# Patient Record
Sex: Female | Born: 1960 | Race: Black or African American | Hispanic: No | Smoking: Never smoker
Health system: Southern US, Community
[De-identification: ages and names within clinical notes are randomized; demographics above are authoritative.]

## PROBLEM LIST (undated history)

## (undated) DIAGNOSIS — A6000 Herpesviral infection of urogenital system, unspecified: Secondary | ICD-10-CM

## (undated) DIAGNOSIS — I10 Essential (primary) hypertension: Secondary | ICD-10-CM

## (undated) HISTORY — DX: Herpesviral infection of urogenital system, unspecified: A60.00

## (undated) HISTORY — DX: Essential (primary) hypertension: I10

## (undated) HISTORY — PX: RETINAL DETACHMENT SURGERY: SHX105

---

## 2002-07-27 DIAGNOSIS — R768 Other specified abnormal immunological findings in serum: Secondary | ICD-10-CM

## 2002-07-27 HISTORY — DX: Other specified abnormal immunological findings in serum: R76.8

## 2017-03-10 ENCOUNTER — Other Ambulatory Visit: Payer: Self-pay | Admitting: Obstetrics and Gynecology

## 2017-03-10 DIAGNOSIS — Z1231 Encounter for screening mammogram for malignant neoplasm of breast: Secondary | ICD-10-CM

## 2017-04-01 ENCOUNTER — Ambulatory Visit
Admission: RE | Admit: 2017-04-01 | Discharge: 2017-04-01 | Disposition: A | Discharge status: Home / Self Care | Payer: No Typology Code available for payment source | Source: Ambulatory Visit | Attending: Obstetrics and Gynecology | Admitting: Obstetrics and Gynecology

## 2017-04-01 ENCOUNTER — Ambulatory Visit (HOSPITAL_COMMUNITY)
Admission: RE | Admit: 2017-04-01 | Discharge: 2017-04-01 | Disposition: A | Discharge status: Home / Self Care | Payer: Self-pay | Source: Ambulatory Visit | Attending: Obstetrics and Gynecology | Admitting: Obstetrics and Gynecology

## 2017-04-01 ENCOUNTER — Encounter (HOSPITAL_COMMUNITY): Payer: Self-pay

## 2017-04-01 VITALS — BP 126/78 | HR 71 | Temp 98.2°F | Ht 62.0 in | Wt 108.0 lb

## 2017-04-01 DIAGNOSIS — Z1231 Encounter for screening mammogram for malignant neoplasm of breast: Secondary | ICD-10-CM

## 2017-04-01 DIAGNOSIS — Z1239 Encounter for other screening for malignant neoplasm of breast: Secondary | ICD-10-CM

## 2017-04-01 NOTE — Patient Instructions (Signed)
Explained breast self awareness with Sharyn BlitzYvette Ressel. Patient did not need a Pap smear today due to last Pap smear was in April 2017 per patient. Let her know [facility] will cover Pap smears every 3 years unless has a history of abnormal Pap smears. [facility]. Appointment scheduled for Thursday, April 01, 2017 at 1110. Let patient know the Breast Center will follow up with her within the next couple weeks with results of mammogram by letter or phone. Sharyn BlitzYvette Woodford verbalized understanding.  Tyshay Adee, Kathaleen Maserhristine Poll, RN 10:25 AM

## 2017-04-01 NOTE — Progress Notes (Signed)
No complaints today.   Pap Smear: Pap smear not completed today. Last Pap smear was in April 2017 at Dr. Allene PyoNewman's [place] in [place] and normal per patient. Per patient has a history of an abnormal Pap smear 40 years ago that cryotherapy was completed for follow-up. Per patient all Pap smears have been normal since cryotherapy. No Pap smear results are in EPIC.  Physical exam: Breasts Breasts symmetrical. No skin abnormalities bilateral breasts. No nipple retraction bilateral breasts. No nipple discharge bilateral breasts. No lymphadenopathy. No lumps palpated bilateral breasts. No complaints of pain or tenderness on exam. [facility]. Appointment scheduled for Thursday, April 01, 2017 at 1110.        Pelvic/Bimanual No Pap smear completed today since last Pap smear was in April 2017 per patient. Pap smear not indicated per [facility] guidelines.   Smoking History: Patient has never smoked.  Patient Navigation: Patient education provided. Access to services provided for patient through [facility] program.   Colorectal Cancer Screening: Per patient had a colonoscopy completed 9 years ago. No complaints today. FIT Test given to patient to complete and return to [facility].

## 2017-04-11 ENCOUNTER — Other Ambulatory Visit: Payer: Self-pay | Admitting: Obstetrics and Gynecology

## 2017-04-17 LAB — FECAL OCCULT BLOOD, IMMUNOCHEMICAL: Fecal Occult Bld: NEGATIVE

## 2017-04-19 ENCOUNTER — Encounter (HOSPITAL_COMMUNITY): Payer: Self-pay | Admitting: *Deleted

## 2017-04-19 NOTE — Progress Notes (Signed)
Letter mailed to patient with negative Fit Test results.  

## 2018-04-11 ENCOUNTER — Other Ambulatory Visit: Payer: Self-pay | Admitting: Family Medicine

## 2018-04-11 ENCOUNTER — Ambulatory Visit
Admission: RE | Admit: 2018-04-11 | Discharge: 2018-04-11 | Disposition: A | Discharge status: Home / Self Care | Payer: BLUE CROSS/BLUE SHIELD | Source: Ambulatory Visit | Attending: Family Medicine | Admitting: Family Medicine

## 2018-04-11 DIAGNOSIS — Z1231 Encounter for screening mammogram for malignant neoplasm of breast: Secondary | ICD-10-CM

## 2018-07-28 ENCOUNTER — Other Ambulatory Visit: Payer: Self-pay

## 2018-07-28 ENCOUNTER — Ambulatory Visit (HOSPITAL_COMMUNITY)
Admission: EM | Admit: 2018-07-28 | Discharge: 2018-07-28 | Disposition: A | Discharge status: Home / Self Care | Payer: BLUE CROSS/BLUE SHIELD

## 2018-07-28 ENCOUNTER — Encounter (HOSPITAL_COMMUNITY): Payer: Self-pay | Admitting: Emergency Medicine

## 2018-07-28 DIAGNOSIS — H6991 Unspecified Eustachian tube disorder, right ear: Secondary | ICD-10-CM

## 2018-07-28 DIAGNOSIS — H6981 Other specified disorders of Eustachian tube, right ear: Secondary | ICD-10-CM | POA: Diagnosis not present

## 2018-07-28 DIAGNOSIS — H6591 Unspecified nonsuppurative otitis media, right ear: Secondary | ICD-10-CM | POA: Insufficient documentation

## 2018-07-28 MED ORDER — CETIRIZINE HCL 10 MG PO TABS: 10.0000 mg | ORAL_TABLET | Freq: Every day | ORAL | 0 refills | Status: DC

## 2018-07-28 MED ORDER — FLUTICASONE PROPIONATE 50 MCG/ACT NA SUSP: 1.0000 | Freq: Every day | NASAL | 2 refills | Status: DC

## 2018-07-28 NOTE — Discharge Instructions (Addendum)
We are treating for sinus congestion and fluid in the ear.  Medications sent to the pharmacy

## 2018-07-28 NOTE — ED Triage Notes (Signed)
PT reports right ear fullness for 2 days.

## 2018-07-31 NOTE — ED Provider Notes (Signed)
[facility]    CSN: [id number] Arrival date & time: 07/28/18  1537     History   Chief Complaint Chief Complaint  Patient presents with  . Ear Fullness    HPI Suella BroadYvette M Nelson is a 58 y.o. female.   Patient is a 58 year old female that presents with bilateral ear pressure, fullness x2 days.  She denies any associated pain in the ears.  Her symptoms have been waxing and waning.  She does not take anything for symptoms.  She denies any associated nasal congestion, drainage, cough, chest congestion, fevers, chills, body aches, night sweats.  ROS per HPI      History reviewed. No pertinent past medical history.  There are no active problems to display for this patient.   History reviewed. No pertinent surgical history.  OB History   No obstetric history on file.      Home Medications    Prior to Admission medications   Medication Sig Start Date End Date Taking? Authorizing Provider  telmisartan-hydrochlorothiazide (MICARDIS HCT) 40-12.5 MG tablet Take 1 tablet by mouth daily.   Yes [provider]  cetirizine (ZYRTEC) 10 MG tablet Take 1 tablet (10 mg total) by mouth daily. 07/28/18   Alexea Blase, Gloris Manchesterraci A, NP  fluticasone (FLONASE) 50 MCG/ACT nasal spray Place 1 spray into both nostrils daily. 07/28/18   Janace ArisBast, Charnay Nazario A, NP    Family History No family history on file.  Social History Social History   Tobacco Use  . Smoking status: Never Smoker  Substance Use Topics  . Alcohol use: No  . Drug use: No     Allergies   Sulfa antibiotics   Review of Systems Review of Systems   Physical Exam Triage Vital Signs ED Triage Vitals  Enc Vitals Group     BP 07/28/18 1634 133/75     Pulse Rate 07/28/18 1634 68     Resp 07/28/18 1634 16     Temp 07/28/18 1634 98.1 F (36.7 C)     Temp Source 07/28/18 1634 Oral     SpO2 07/28/18 1634 100 %     Weight --      Height --      Head Circumference --      Peak Flow --      Pain Score 07/28/18 1633 4       Pain Loc --      Pain Edu? --      Excl. in GC? --    No data found.  Updated Vital Signs BP 133/75   Pulse 68   Temp 98.1 F (36.7 C) (Oral)   Resp 16   SpO2 100%   Visual Acuity Right Eye Distance:   Left Eye Distance:   Bilateral Distance:    Right Eye Near:   Left Eye Near:    Bilateral Near:     Physical Exam Vitals signs and nursing note reviewed.  Constitutional:      Appearance: Normal appearance. She is not ill-appearing or toxic-appearing.  HENT:     Head: Normocephalic and atraumatic.     Right Ear: Decreased hearing noted. A middle ear effusion is present. Tympanic membrane is not erythematous, retracted or bulging.     Left Ear: Hearing, tympanic membrane and ear canal normal.     Nose: Nose normal.     Mouth/Throat:     Mouth: Mucous membranes are moist.     Pharynx: Oropharynx is clear.  Eyes:     Conjunctiva/sclera: Conjunctivae  normal.  Cardiovascular:     Rate and Rhythm: Normal rate and regular rhythm.     Heart sounds: Normal heart sounds.  Pulmonary:     Effort: Pulmonary effort is normal.     Breath sounds: Normal breath sounds.  Musculoskeletal: Normal range of motion.  Skin:    General: Skin is warm and dry.  Neurological:     Mental Status: She is alert.  Psychiatric:        Mood and Affect: Mood normal.      UC Treatments / Results  Labs (all labs ordered are listed, but only abnormal results are displayed) Labs Reviewed - No data to display  EKG None  Radiology No results found.  Procedures Procedures (including critical care time)  Medications Ordered in UC Medications - No data to display  Initial Impression / Assessment and Plan / UC Course  I have reviewed the triage vital signs and the nursing notes.  Pertinent labs & imaging results that were available during my care of the patient were reviewed by me and considered in my medical decision making (see chart for details).     Eustachian tube dysfunction  and right middle ear effusion Will treat with Flonase and Zyrtec for symptoms Follow up as needed for continued or worsening symptoms  Final Clinical Impressions(s) / UC Diagnoses   Final diagnoses:  Dysfunction of right eustachian tube  Fluid level behind tympanic membrane of right ear     Discharge Instructions     We are treating for sinus congestion and fluid in the ear.  Medications sent to the pharmacy    ED Prescriptions    Medication Sig Dispense Auth. Provider   fluticasone (FLONASE) 50 MCG/ACT nasal spray Place 1 spray into both nostrils daily. 16 g Sergi Gellner A, NP   cetirizine (ZYRTEC) 10 MG tablet Take 1 tablet (10 mg total) by mouth daily. 30 tablet Janace Aris, NP     Controlled Substance Prescriptions [place] Controlled Substance Registry consulted? no   Janace Aris, NP 07/31/18 2027

## 2019-04-18 ENCOUNTER — Other Ambulatory Visit: Payer: Self-pay

## 2019-04-18 DIAGNOSIS — Z20822 Contact with and (suspected) exposure to covid-19: Secondary | ICD-10-CM

## 2019-04-19 LAB — NOVEL CORONAVIRUS, NAA: SARS-CoV-2, NAA: NOT DETECTED

## 2019-06-13 ENCOUNTER — Other Ambulatory Visit: Payer: Self-pay

## 2019-06-13 DIAGNOSIS — Z20822 Contact with and (suspected) exposure to covid-19: Secondary | ICD-10-CM

## 2019-06-15 LAB — NOVEL CORONAVIRUS, NAA: SARS-CoV-2, NAA: NOT DETECTED

## 2019-09-04 ENCOUNTER — Other Ambulatory Visit: Payer: Self-pay | Admitting: *Deleted

## 2019-09-04 DIAGNOSIS — Z20822 Contact with and (suspected) exposure to covid-19: Secondary | ICD-10-CM

## 2019-09-05 LAB — NOVEL CORONAVIRUS, NAA: SARS-CoV-2, NAA: NOT DETECTED

## 2019-09-25 ENCOUNTER — Ambulatory Visit: Payer: Self-pay | Attending: Internal Medicine

## 2019-09-25 ENCOUNTER — Ambulatory Visit: Payer: BLUE CROSS/BLUE SHIELD

## 2019-09-25 DIAGNOSIS — Z23 Encounter for immunization: Secondary | ICD-10-CM | POA: Insufficient documentation

## 2019-09-25 NOTE — Progress Notes (Signed)
   Covid-19 Vaccination Clinic  Name:  Tasha Nelson    MRN: [id number] DOB: Jun 02, 1961  09/25/2019  [name] was observed post Covid-19 immunization for 15 minutes without incidence. She was provided with Vaccine Information Sheet and instruction to access the V-Safe system.   [name] was instructed to call 911 with any severe reactions post vaccine: Marland Kitchen Difficulty breathing  . Swelling of your face and throat  . A fast heartbeat  . A bad rash all over your body  . Dizziness and weakness    Immunizations Administered    Name Date Dose VIS Date Route   Pfizer COVID-19 Vaccine 09/25/2019  9:19 AM 0.3 mL 07/07/2019 Intramuscular   Manufacturer: [employer], [employer]   Lot: [id number]   NDC: [address]-5

## 2019-10-24 ENCOUNTER — Ambulatory Visit: Payer: Self-pay | Attending: Internal Medicine

## 2019-10-24 DIAGNOSIS — Z23 Encounter for immunization: Secondary | ICD-10-CM

## 2019-10-24 NOTE — Progress Notes (Signed)
   Covid-19 Vaccination Clinic  Name:  [name]    MRN: [id number] DOB: 1961/04/22  10/24/2019  [name] was observed post Covid-19 immunization for 15 minutes without incident. She was provided with Vaccine Information Sheet and instruction to access the V-Safe system.   [name] was instructed to call 911 with any severe reactions post vaccine: Marland Kitchen Difficulty breathing  . Swelling of face and throat  . A fast heartbeat  . A bad rash all over body  . Dizziness and weakness   Immunizations Administered    Name Date Dose VIS Date Route   Pfizer COVID-19 Vaccine 10/24/2019  4:15 PM 0.3 mL 07/07/2019 Intramuscular   Manufacturer: [employer], [employer]   Lot: [id number]   NDC: [address]-4

## 2020-02-02 ENCOUNTER — Other Ambulatory Visit: Payer: Self-pay

## 2020-02-02 ENCOUNTER — Ambulatory Visit (INDEPENDENT_AMBULATORY_CARE_PROVIDER_SITE_OTHER): Payer: 59 | Admitting: Family Medicine

## 2020-02-02 ENCOUNTER — Encounter: Payer: Self-pay | Admitting: Family Medicine

## 2020-02-02 VITALS — BP 98/58 | HR 82 | Ht 62.0 in | Wt 104.2 lb

## 2020-02-02 DIAGNOSIS — Z01419 Encounter for gynecological examination (general) (routine) without abnormal findings: Secondary | ICD-10-CM | POA: Insufficient documentation

## 2020-02-02 DIAGNOSIS — I10 Essential (primary) hypertension: Secondary | ICD-10-CM | POA: Diagnosis not present

## 2020-02-02 DIAGNOSIS — Z7689 Persons encountering health services in other specified circumstances: Secondary | ICD-10-CM

## 2020-02-02 DIAGNOSIS — A6000 Herpesviral infection of urogenital system, unspecified: Secondary | ICD-10-CM

## 2020-02-02 NOTE — Assessment & Plan Note (Addendum)
Well-controlled.  Uses Valacyclovir as needed.

## 2020-02-02 NOTE — Patient Instructions (Addendum)
Thank you for coming to see me today. It was a pleasure to meet you. Today we talked about:   Your health and updated everything.  Keep checking your blood pressure every morning.  Please follow-up with me in 2-3 months for labs and possible pap smear.  If you have any questions or concerns, please do not hesitate to call the office at [phone].  Best,   Luis Abed, DO

## 2020-02-02 NOTE — Assessment & Plan Note (Signed)
New patient packet reviewed.  Patient's past medical, surgical, family, social history, medication list updated in chart and verified with patient.  Patient brought with her records that show a prior positive hepatitis B surface antigen in 2004.  Other labs are in care everywhere she was previously seen in the [facility] system.  Health maintenance items that will be updated in the future include colonoscopy and Pap smear.  Patient plans to come back in 2 to 3 months to address these.

## 2020-02-02 NOTE — Progress Notes (Signed)
    SUBJECTIVE:   CHIEF COMPLAINT / HPI:   Establish Care Patient presents today to establish care Change in insurance prompted the change in PCP Previous PCP Donnal Moat, [place] [facility] LMP 10-15 yrs ago  Hypertension Meds: Telmisa-HCTZ 40-12.5mg  States that she keeps a log at home Her head felt "different" not painful on 7/4 and for this reason was checking it a lot It was fluctating that day, highest was 150/83 States that her blood pressure varies a lot, usually 120-140 SBP Usually checks BP every morning Has been under a lot of stress lately with looking for a job  Genital Herpes Reports it is very well-controlled  Uses valacyclovir as needed for outbreaks  Pap Smear Thinks one was abnormal in early 68s Last in 2017-2018  PERTINENT  PMH / PSH: Hypertension, Hx positive hep B surface antigen in 2004, genital herpes  OBJECTIVE:   BP (!) 98/58   Pulse 82   Ht 5\' 2"  (1.575 m)   Wt 104 lb 3.2 oz (47.3 kg)   SpO2 99%   BMI 19.06 kg/m    Physical Exam:  General: 59 y.o. female in NAD HEENT: NCAT Neck: Supple, no thyromegaly, no cervical LAD Cardio: RRR no m/r/g Lungs: CTAB, no wheezing, no rhonchi, no crackles, no IWOB on RA Abdomen: Soft, non-tender to palpation, non-distended, positive bowel sounds Skin: warm and dry Extremities: No edema   ASSESSMENT/PLAN:   Establishing care with new doctor, encounter for New patient packet reviewed.  Patient's past medical, surgical, family, social history, medication list updated in chart and verified with patient.  Patient brought with her records that show a prior positive hepatitis B surface antigen in 2004.  Other labs are in care everywhere she was previously seen in the [facility] system.  Health maintenance items that will be updated in the future include colonoscopy and Pap smear.  Patient plans to come back in 2 to 3 months to address these.  Essential hypertension Has been fluctuating, on lower  side today.  Patient is asymptomatic.  Does report some elevated blood pressures, but states overall she feels like her blood pressure is very well controlled.  Thinks that stress may be attributing to the increase in recent days as she has been looking for a job which is very stressful.  Offered ambulatory blood pressure monitoring, but she would like to wait and see if stress improves her blood pressure control.  Advised to continue to check in the morning.  If it continues to fluctuate, would refer to [facility] for ambulatory blood pressure monitoring.  For now, we will continue telmisartan-HCTZ at current dose.  Genital herpes simplex Well-controlled.  Uses Valacyclovir as needed.     Raymondo Band, DO [facility]

## 2020-02-02 NOTE — Assessment & Plan Note (Signed)
Has been fluctuating, on lower side today.  Patient is asymptomatic.  Does report some elevated blood pressures, but states overall she feels like her blood pressure is very well controlled.  Thinks that stress may be attributing to the increase in recent days as she has been looking for a job which is very stressful.  Offered ambulatory blood pressure monitoring, but she would like to wait and see if stress improves her blood pressure control.  Advised to continue to check in the morning.  If it continues to fluctuate, would refer to Dr. Raymondo Band for ambulatory blood pressure monitoring.  For now, we will continue telmisartan-HCTZ at current dose.

## 2020-03-14 ENCOUNTER — Telehealth: Payer: Self-pay | Admitting: *Deleted

## 2020-03-14 NOTE — Telephone Encounter (Signed)
Received fax requesting a refill on Tasha Nelson 40-12.5mg  tab QTY:90 PGF:[id number] 1 tablet by mouth daily.  Wasn't sure if this was the same as the one on her med list that is by a historical provider.Jameel Quant Zimmerman Rumple, CMA

## 2020-05-07 NOTE — Progress Notes (Signed)
SUBJECTIVE:   CHIEF COMPLAINT / HPI:   Hypertension Current regimen: Telmisartan-HCTZ 40-12.5 mg Last BMP 02/27/2019 She has been off her medication for about 2 months  Colon cancer screening Per office note on 02/27/2019 from previous PCP, patient had a colonoscopy in 2012 with normal results, due for repeat in 2022 Also reports that she had a negative Cologuard in 2018  Cervical cancer screening Per same office note as above, last Pap smear 2019 and normal Results cannot be found in Care Everywhere  Breast Cancer Screening She has been called and is going to schedule it Last was 04/04/2019  Back/Left Hip Pain Has low back pain and stiffness and left hip pain, loosens and pain improves as she walks more  Has been having back stiffness off and on for months No radiation down her legs No leg weakness, numbness, tingling She walks a lot at school Tries to do a lot of stretches 2018 an air conditioner fell on her left hip, but no problems until now She has been having pain and catching in left hip about 2-3 weeks, not every day Worse bending or twisting a certain way No change in bowel or bladder habits  PERTINENT  PMH / PSH: Hypertension, history of genital herpes  OBJECTIVE:   BP (!) 172/84   Pulse 71   Ht 5\' 2"  (1.575 m)   Wt 103 lb 3.2 oz (46.8 kg)   SpO2 99%   BMI 18.88 kg/m    Physical Exam:  General: 59 y.o. female in NAD Lungs: Breathing comfortably on room air Skin: warm and dry Extremities: No edema MSK:  Back Pain Inspection: no gross deformity, mildly decreased lumbar lordosis Palpation: No TTP along lumbar spinous processes, no step-offs noted ROM: Flexion limited to 150 degrees, full extension, sidebending, rotation Strength: 5/5 strength BLE Special Tests: Negative stork sign bilaterally, negative straight leg raise bilaterally Neurovascular: Intact distally bilaterally  Left Hip:  - Inspection: No gross deformity, no swelling, erythema, or  ecchymosis bilaterally - Palpation: No TTP, specifically none over greater trochanter bilaterally, no tenderness palpation over piriformis bilaterally - ROM: bilateral legs with hip flexion limited to 60 degrees bilaterally, Normal range of motion in extension, abduction, internal and external rotation - Strength: Normal strength bilaterally - Neuro/vasc: NV intact distally bilaterally - Special Tests: Negative FABER and FADIR bilaterally.  Negative logroll on left     ASSESSMENT/PLAN:   Essential hypertension Patient has been without her medication for over a month, likely 2 months.  Have sent a refill for this medication.  Advised patient to call if this happens again.  We will continue telmisartan-hydrochlorothiazide 40-12.5 mg daily right now.  Given that she has not been taking this medication, will wait for BMP in 1 month.  Will obtain screening labs including HIV and hepatitis C at that time as well.  She is asymptomatic from her elevated blood pressure and was advised to pick up her prescription as soon as possible and take her medication today.  Chronic bilateral low back pain without sciatica She does not have any evidence of disc involvement.  No signs of nerve compression as well.  She has very tight hamstrings on examination and this is likely contributing to her stiffness.  Also advised that given her pain improves with walking, she could have a small amount of arthritis.  Will work on gentle stretching range of motion exercises with patient, have her flex, extend, rotate, side bend and her back.  She has seen  a chiropractor before, advised that this would be fine to go back.  She does not seem to have any hip pathology on her exam aside from tight hamstrings and will work on stretches per above.   Patient received a flu shot today.  She will return in 1 month for blood pressure recheck and BMP, hepatitis C, HIV.  She was advised to schedule her mammogram.  She can have a  colonoscopy in 2022.  Unknown Jim, DO [facility]

## 2020-05-08 ENCOUNTER — Encounter: Payer: Self-pay | Admitting: Family Medicine

## 2020-05-08 ENCOUNTER — Ambulatory Visit (INDEPENDENT_AMBULATORY_CARE_PROVIDER_SITE_OTHER): Payer: 59 | Admitting: Family Medicine

## 2020-05-08 ENCOUNTER — Ambulatory Visit: Payer: 59 | Admitting: Family Medicine

## 2020-05-08 ENCOUNTER — Other Ambulatory Visit: Payer: Self-pay

## 2020-05-08 VITALS — BP 172/84 | HR 71 | Ht 62.0 in | Wt 103.2 lb

## 2020-05-08 DIAGNOSIS — I10 Essential (primary) hypertension: Secondary | ICD-10-CM | POA: Diagnosis not present

## 2020-05-08 DIAGNOSIS — Z23 Encounter for immunization: Secondary | ICD-10-CM

## 2020-05-08 DIAGNOSIS — M545 Low back pain, unspecified: Secondary | ICD-10-CM | POA: Diagnosis not present

## 2020-05-08 DIAGNOSIS — G8929 Other chronic pain: Secondary | ICD-10-CM | POA: Diagnosis not present

## 2020-05-08 MED ORDER — TELMISARTAN-HCTZ 40-12.5 MG PO TABS: 1.0000 | ORAL_TABLET | Freq: Every day | ORAL | 1 refills | Status: DC

## 2020-05-08 NOTE — Assessment & Plan Note (Signed)
Patient has been without her medication for over a month, likely 2 months.  Have sent a refill for this medication.  Advised patient to call if this happens again.  We will continue telmisartan-hydrochlorothiazide 40-12.5 mg daily right now.  Given that she has not been taking this medication, will wait for BMP in 1 month.  Will obtain screening labs including HIV and hepatitis C at that time as well.  She is asymptomatic from her elevated blood pressure and was advised to pick up her prescription as soon as possible and take her medication today.

## 2020-05-08 NOTE — Patient Instructions (Signed)
Thank you for coming to see me today. It was a pleasure. Today we talked about:   I have sent a prescription for your blood pressure medication. Please make sure that you pick this up today and take it today. We will see you back in 1 month and recheck your labs at that time when she has been on your medication for a month.  Schedule your breast mammogram.  For your back you can work on stretching, gentle flexion, extension, sidebending, rotation. Do not move past pain. When you feel the stretch, that is where you should hold it. You can also see the chiropractor as he has had success with this previously.  Please follow-up with me in 1 month.  If you have any questions or concerns, please do not hesitate to call the office at [phone].  Best,   Luis Abed, DO

## 2020-05-08 NOTE — Assessment & Plan Note (Signed)
She does not have any evidence of disc involvement.  No signs of nerve compression as well.  She has very tight hamstrings on examination and this is likely contributing to her stiffness.  Also advised that given her pain improves with walking, she could have a small amount of arthritis.  Will work on gentle stretching range of motion exercises with patient, have her flex, extend, rotate, side bend and her back.  She has seen a chiropractor before, advised that this would be fine to go back.  She does not seem to have any hip pathology on her exam aside from tight hamstrings and will work on stretches per above.

## 2020-05-21 ENCOUNTER — Other Ambulatory Visit: Payer: Self-pay | Admitting: Family Medicine

## 2020-05-21 DIAGNOSIS — Z1231 Encounter for screening mammogram for malignant neoplasm of breast: Secondary | ICD-10-CM

## 2020-05-22 ENCOUNTER — Other Ambulatory Visit: Payer: Self-pay

## 2020-05-22 ENCOUNTER — Ambulatory Visit
Admission: RE | Admit: 2020-05-22 | Discharge: 2020-05-22 | Disposition: A | Discharge status: Home / Self Care | Payer: 59 | Source: Ambulatory Visit | Attending: *Deleted | Admitting: *Deleted

## 2020-05-22 DIAGNOSIS — Z1231 Encounter for screening mammogram for malignant neoplasm of breast: Secondary | ICD-10-CM

## 2020-07-02 ENCOUNTER — Ambulatory Visit (INDEPENDENT_AMBULATORY_CARE_PROVIDER_SITE_OTHER): Payer: 59 | Admitting: Family Medicine

## 2020-07-02 ENCOUNTER — Encounter: Payer: Self-pay | Admitting: Family Medicine

## 2020-07-02 ENCOUNTER — Other Ambulatory Visit (HOSPITAL_COMMUNITY)
Admission: RE | Admit: 2020-07-02 | Discharge: 2020-07-02 | Disposition: A | Discharge status: Home / Self Care | Payer: 59 | Source: Ambulatory Visit | Attending: Family Medicine | Admitting: Family Medicine

## 2020-07-02 ENCOUNTER — Other Ambulatory Visit: Payer: Self-pay

## 2020-07-02 VITALS — BP 125/75 | HR 75 | Ht 62.0 in | Wt 104.6 lb

## 2020-07-02 DIAGNOSIS — I10 Essential (primary) hypertension: Secondary | ICD-10-CM | POA: Diagnosis not present

## 2020-07-02 DIAGNOSIS — Z114 Encounter for screening for human immunodeficiency virus [HIV]: Secondary | ICD-10-CM

## 2020-07-02 DIAGNOSIS — Z01419 Encounter for gynecological examination (general) (routine) without abnormal findings: Secondary | ICD-10-CM | POA: Diagnosis present

## 2020-07-02 DIAGNOSIS — Z1159 Encounter for screening for other viral diseases: Secondary | ICD-10-CM | POA: Diagnosis not present

## 2020-07-02 NOTE — Assessment & Plan Note (Signed)
BP significantly improved since restarting her medication.  Continue telmisartan-hydrochlorothiazide at current dose.  Will obtain a BMP today given that she had been off of her medication for 2 months.  Follow-up in 3 months.

## 2020-07-02 NOTE — Progress Notes (Signed)
    SUBJECTIVE:   CHIEF COMPLAINT / HPI:   Hypertension Current regimen: Telmisartan-hydrochlorothiazide 40-12.5 mg daily Has been taking Bps are improving, 105/65 most recent, 136/79 around the highest she has seen Denies chest pain, shortness of breath, leg swelling, changes in vision  Pap Smear Last Pap smear 2019 with normal reports per office note.  Prior paps: thinks she had something abnormal as a teenager No LMP recorded. Patient is postmenopausal.  Stopped having periods over 10 years Postmenopausal bleeding: none Sexually Active: no  Desire for STD Screening: none  Last mammogram: 05/22/2020, negative Self breast exams: yes  Concerns: none    PERTINENT  PMH / PSH: Hypertension, history of genital herpes  OBJECTIVE:   BP 125/75   Pulse 75   Ht 5\' 2"  (1.575 m)   Wt 104 lb 9.6 oz (47.4 kg)   SpO2 100%   BMI 19.13 kg/m    Physical Exam: General: In NAD Respiratory: Breathing comfortably on room air GU: Pelvic exam performed with patient supine.  Chaperone in room.  Bilateral labia without abnormalities.  Cervix exhibits no discharge, no cervix abnormalities.  No vaginal lesions, atrophic tissue noted.  No vaginal discharge.   ASSESSMENT/PLAN:   Women's annual routine gynecological examination Pap smear performed today with HPV interpretation.  If this is negative, would be her last Pap smear.  Essential hypertension BP significantly improved since restarting her medication.  Continue telmisartan-hydrochlorothiazide at current dose.  Will obtain a BMP today given that she had been off of her medication for 2 months.  Follow-up in 3 months.   Hepatitis C antibody and HIV antibody obtained for screening purposes.  , DO [facility]

## 2020-07-02 NOTE — Assessment & Plan Note (Signed)
Pap smear performed today with HPV interpretation.  If this is negative, would be her last Pap smear.

## 2020-07-02 NOTE — Patient Instructions (Signed)
Thank you for coming to see me today. It was a pleasure. Today we talked about:   We obtained a Pap smear today.  We will send you a letter with the results unless we need to do something, then we will call you.  We will get some labs today.  If they are abnormal or we need to do something about them, I will call you.  If they are normal, I will send you a message on MyChart (if it is active) or a letter in the mail.  If you don't hear from [place] in 2 weeks, please call the office at the number below.  Please follow-up with me in 3 months or sooner as needed for your blood pressure.  If you have any questions or concerns, please do not hesitate to call the office at [phone].  Best,   Luis Abed, DO

## 2020-07-03 ENCOUNTER — Encounter: Payer: Self-pay | Admitting: Family Medicine

## 2020-07-03 LAB — BASIC METABOLIC PANEL
BUN/Creatinine Ratio: 28 (ref 12–28)
BUN: 19 mg/dL (ref 8–27)
CO2: 23 mmol/L (ref 20–29)
Calcium: 9.9 mg/dL (ref 8.7–10.3)
Chloride: 99 mmol/L (ref 96–106)
Creatinine, Ser: 0.68 mg/dL (ref 0.57–1.00)
GFR calc Af Amer: 109 mL/min/{1.73_m2} (ref >59)
GFR calc non Af Amer: 95 mL/min/{1.73_m2} (ref >59)
Glucose: 84 mg/dL (ref 65–99)
Potassium: 3.8 mmol/L (ref 3.5–5.2)
Sodium: 141 mmol/L (ref 134–144)

## 2020-07-03 LAB — HIV ANTIBODY (ROUTINE TESTING W REFLEX): HIV Screen 4th Generation wRfx: NONREACTIVE

## 2020-07-03 LAB — HCV AB W REFLEX TO QUANT PCR: HCV Ab: <0.1 s/co ratio (ref 0.0–0.9)

## 2020-07-03 LAB — HCV INTERPRETATION

## 2020-07-04 LAB — CYTOLOGY - PAP
Comment: NEGATIVE
Diagnosis: NEGATIVE
High risk HPV: NEGATIVE

## 2020-09-16 ENCOUNTER — Telehealth: Payer: Self-pay

## 2020-09-16 NOTE — Telephone Encounter (Signed)
Patient calls nurse line requesting December lab results to be mailed to her home. I have printed labs and placed them in outgoing mail. Confirmed address with patient.

## 2020-10-04 ENCOUNTER — Telehealth: Payer: Self-pay | Admitting: Family Medicine

## 2020-10-04 NOTE — Telephone Encounter (Signed)
Patient stopped by was seen on 07/02/2020 she states had lab work done.  She would like nurse or her doctor to call her and explain in layman terms results of the blood work.  Patients p#[phone].

## 2020-10-07 NOTE — Telephone Encounter (Signed)
Spoke with patient.  She was wondering what the ** information listed from [facility] was at the bottom of the lab results.  Advised her that this is just a message that states they are updated their GFR calculations from change in 2021 guidelines.  Advised that her results are completely normal.  Discussed meaning of GFR.  She was reassured.  She states that she is concerned that she has scoliosis and will send photos.  Given mychart activation link.

## 2020-11-15 ENCOUNTER — Telehealth: Payer: Self-pay | Admitting: Family Medicine

## 2020-11-15 NOTE — Telephone Encounter (Signed)
Patient came in today with old paperwork from last December and she would like to know what the highlighted information on the letter means.  Put paperwork in white folder.  Please call patient she's just curious about it.  Her P#[phone].  Please call her.  Thanks

## 2020-11-20 NOTE — Telephone Encounter (Signed)
Confirmed with Grayce about this and she said she did not place it in white team folder she placed it in doctors box so I will route to PCP as an FYI. Davelle Anselmi Zimmerman Rumple, CMA

## 2020-11-21 NOTE — Telephone Encounter (Signed)
Called patient.  No answer.  Left VM that all labs were normal.  Advised that HPV is tested on all paps for patients age 62 and older, this was negative.  The cells of the pap smear itself were normal.  Also advised that Hep C Antibody testing, as we discussed at visit when it was ordered, is performed on all adults at least once and this was normal.  All other labs listed were completely normal.  Advised to call back if any further questions.

## 2020-12-02 ENCOUNTER — Ambulatory Visit (INDEPENDENT_AMBULATORY_CARE_PROVIDER_SITE_OTHER): Payer: 59 | Admitting: Family Medicine

## 2020-12-02 ENCOUNTER — Telehealth: Payer: Self-pay | Admitting: Family Medicine

## 2020-12-02 ENCOUNTER — Other Ambulatory Visit: Payer: Self-pay

## 2020-12-02 VITALS — BP 130/67 | HR 82 | Temp 98.2°F | Ht 62.0 in

## 2020-12-02 DIAGNOSIS — J302 Other seasonal allergic rhinitis: Secondary | ICD-10-CM | POA: Diagnosis not present

## 2020-12-02 MED ORDER — LORATADINE 10 MG PO TABS: 10.0000 mg | ORAL_TABLET | Freq: Every day | ORAL | 11 refills | Status: DC

## 2020-12-02 NOTE — Telephone Encounter (Signed)
Pt submitted document for review of medication alternative and cost.  Last appt was 12/02/20. Form placed white team folder.

## 2020-12-02 NOTE — Progress Notes (Signed)
    SUBJECTIVE:   CHIEF COMPLAINT / HPI:   Patient was mowing the lawn Thursday, and then after that she developd cough, sneezing, and scratchy throat.  She can feel drainage in the back of her throat.  Nose is productive of clear mucous.  She had a 'light temperature' this morning (It was 98.1, usually 97).  She takes benadryl for her allergies.  She only took two last night.  Did not take any prior to this..    When she was mowing the lawn she noticed something 'moving' when sh2 touched it under her left rib.  It was nontender.  She felt a slight pain there later when she coughed, but otherwise no pain..  She think thes handle her lawn more may hve been pushing into her ribs.  No difficulty breathing.  No bruising .   PERTINENT  PMH / PSH: Allergies  OBJECTIVE:   BP 130/67   Pulse 82   Temp 98.2 F (36.8 C) (Oral)   Ht 5\' 2"  (1.575 m)   SpO2 100%   BMI 19.13 kg/m   General: Alert, oriented.  No acute distress.  Thin female, appears stated age. HEENT: PERRLA, EOMI, moist oral mucosa, CV: Regular rate and rhythm, no murmurs : Lungs clear to auscultation bilaterally, no wheezes or crackles. MSK: Tip of the left 11th rib appreciated.  Nontender.  ASSESSMENT/PLAN:   Seasonal allergies Experienced rhinitis and cough after mowing the lawn recently.  Advised patient to take second-generation antihistamine such as Claritin or Zyrtec over Benadryl.  Discussed differences between the 2 with patient.  Also recommended Flonase if symptoms are severe enough.  Discussed use of Flonase as well.  Prescribed Claritin for patient.   Palpable left rib mass This mass she is feeling is consistent with the tip of the left 11th rib on my exam.  Reassured patient.  Advised her she can always follow-up if this starts to develop pain.  , MD [facility]

## 2020-12-02 NOTE — Assessment & Plan Note (Signed)
Experienced rhinitis and cough after mowing the lawn recently.  Advised patient to take second-generation antihistamine such as Claritin or Zyrtec over Benadryl.  Discussed differences between the 2 with patient.  Also recommended Flonase if symptoms are severe enough.  Discussed use of Flonase as well.  Prescribed Claritin for patient.

## 2020-12-02 NOTE — Patient Instructions (Signed)
It was nice to see you today,  Your cough and runny nose are most likely due to allergies.  Claritin or Zyrtec or over-the-counter options that I would prefer you use over Benadryl.  They are newer and have less side effects than Benadryl.  I have also sent in a prescription for Claritin, but it is over-the-counter and therefore might not be covered by your insurance.  You can also use Flonase for allergies.  When using this you need to spray in each nostril once a day and continue to do this every day for at least 3 to 4 weeks before deciding that its not working.  The lump you are feeling below your ribs is most likely your 11th rib.  The 11th and 12 ribs are called the floating ribs because they do not connect with the sternum.  Have a great day,  Frederic Jericho, MD

## 2020-12-04 NOTE — Telephone Encounter (Signed)
I reviewed the form in my inbox and it appears patient has questions about best treatment options for her hypertension.  This patient has had multiple questions about her labs and medications recently.  I think it would be best for her to come in for an appointment to better assess.  I last saw her in December and wanted to see her again in 3 months, so she is actually overdue to see me.  Would we be able to call her to schedule an appointment to discuss the medication and her blood pressure?  Thanks!

## 2020-12-04 NOTE — Telephone Encounter (Signed)
Clinical info completed on [employer] form.  Placed form in Dr Tamela Oddi box for completion.  Sunday Spillers, CMA

## 2020-12-10 NOTE — Telephone Encounter (Signed)
LVM for pt to call our office to schedule an appt to discuss the medication. Sunday Spillers, CMA

## 2020-12-13 NOTE — Telephone Encounter (Signed)
Pt called, scheduled an appt on 12/27/20. Sunday Spillers, CMA

## 2020-12-25 NOTE — Progress Notes (Signed)
    SUBJECTIVE:   CHIEF COMPLAINT / HPI:   Hypertension Current regimen: Telmisartan-hydrochlorothiazide 40-12.5 mg Takes it every day, but forgot to take her medication this morning Was told by insurance to check for formulary alternative Checks at home and they are in 120s SBP She had written a letter and dropped it off because her insurance wanted her to change from telmisartan-HCTZ to valsartan-HCTZ and she was concerned about the change in milligrams She reports that she has been focusing on her health and has been working on eating an appropriate diet and exercising more  She did a home stool test that was normal through [facility]  PERTINENT  PMH / PSH: Hypertension  OBJECTIVE:   BP (!) 141/80   Pulse 85   Ht 5\' 2"  (1.575 m)   Wt 107 lb 6.4 oz (48.7 kg)   SpO2 98%   BMI 19.64 kg/m    Physical Exam:  General: 60 y.o. female in NAD Lungs: Breathing comfortably on room air Skin: warm and dry Extremities: No edema   ASSESSMENT/PLAN:   Essential hypertension BP is elevated today, patient did not take her medication this morning because she forgot.  Reports well-controlled BPs at home.  Discussed at length the difference in milligram dosages for different medications and how they are not always equivalent.  Discussed with her that the equivalent dose of valsartan would be 160 mg to compare to her telmisartan 40 mg.  She would still like to start slower especially because she is hoping to wean herself off of blood pressure medication in the future by healthy lifestyle changes.  We will start her on valsartan-HCTZ 80-12.5 mg daily.  She will continue to check her blood pressures and was advised to call if they are consistently elevated with this change.  No washout needed given change from ARB to ARB.  We will have her follow-up in 1 month with her new PCP.   Colonoscopy ordered, given paper  66, DO [facility]

## 2020-12-27 ENCOUNTER — Other Ambulatory Visit: Payer: Self-pay

## 2020-12-27 ENCOUNTER — Encounter: Payer: Self-pay | Admitting: Family Medicine

## 2020-12-27 ENCOUNTER — Ambulatory Visit (INDEPENDENT_AMBULATORY_CARE_PROVIDER_SITE_OTHER): Payer: 59 | Admitting: Family Medicine

## 2020-12-27 VITALS — BP 141/80 | HR 85 | Ht 62.0 in | Wt 107.4 lb

## 2020-12-27 DIAGNOSIS — I10 Essential (primary) hypertension: Secondary | ICD-10-CM

## 2020-12-27 DIAGNOSIS — Z1211 Encounter for screening for malignant neoplasm of colon: Secondary | ICD-10-CM | POA: Diagnosis not present

## 2020-12-27 MED ORDER — VALSARTAN-HYDROCHLOROTHIAZIDE 80-12.5 MG PO TABS: 1.0000 | ORAL_TABLET | Freq: Every day | ORAL | 1 refills | Status: DC

## 2020-12-27 NOTE — Patient Instructions (Signed)
Thank you for coming to see me today. It was a pleasure. Today we talked about:   We will change from telmisartan-hydrochlorothiazide 40-12.5mg  to valsartan-hydrochlorothiazide 80mg -12.5mg .  This is a bit of a decrease, so keep an eye on your blood pressure.    Please follow-up with new PCP in 1 month.  If you have any questions or concerns, please do not hesitate to call the office at [phone].  Best,   [phone], DO

## 2020-12-27 NOTE — Assessment & Plan Note (Signed)
BP is elevated today, patient did not take her medication this morning because she forgot.  Reports well-controlled BPs at home.  Discussed at length the difference in milligram dosages for different medications and how they are not always equivalent.  Discussed with her that the equivalent dose of valsartan would be 160 mg to compare to her telmisartan 40 mg.  She would still like to start slower especially because she is hoping to wean herself off of blood pressure medication in the future by healthy lifestyle changes.  We will start her on valsartan-HCTZ 80-12.5 mg daily.  She will continue to check her blood pressures and was advised to call if they are consistently elevated with this change.  No washout needed given change from ARB to ARB.  We will have her follow-up in 1 month with her new PCP.

## 2021-01-03 ENCOUNTER — Ambulatory Visit (INDEPENDENT_AMBULATORY_CARE_PROVIDER_SITE_OTHER): Payer: 59 | Admitting: Family Medicine

## 2021-01-03 ENCOUNTER — Other Ambulatory Visit: Payer: Self-pay

## 2021-01-03 VITALS — BP 126/77 | HR 78 | Temp 97.3°F | Ht 62.0 in

## 2021-01-03 DIAGNOSIS — J069 Acute upper respiratory infection, unspecified: Secondary | ICD-10-CM | POA: Insufficient documentation

## 2021-01-03 DIAGNOSIS — J302 Other seasonal allergic rhinitis: Secondary | ICD-10-CM

## 2021-01-03 MED ORDER — BENZONATATE 100 MG PO CAPS: 100.0000 mg | ORAL_CAPSULE | Freq: Two times a day (BID) | ORAL | 0 refills | Status: DC | PRN

## 2021-01-03 MED ORDER — LORATADINE 10 MG PO TABS: 10.0000 mg | ORAL_TABLET | Freq: Every day | ORAL | 11 refills | Status: DC

## 2021-01-03 MED ORDER — FLUTICASONE PROPIONATE 50 MCG/ACT NA SUSP: 1.0000 | Freq: Every day | NASAL | 2 refills | Status: DC

## 2021-01-03 NOTE — Progress Notes (Signed)
    SUBJECTIVE:   CHIEF COMPLAINT / HPI: cold symptoms  URI: Primary symptom: coughing at night, post-tussive emesis, runny nose Duration: 1 week Fever? 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  PERTINENT  PMH / PSH: seasonal allergies  OBJECTIVE:   BP 126/77   Pulse 78   Temp (!) 97.3 F (36.3 C)   Ht 5\' 2"  (1.575 m)   SpO2 98%   BMI 19.64 kg/m   Nursing note and vitals reviewed GEN: age-appropriate AAW, resting comfortably in chair, NAD, WNWD HEENT: NCAT. PERRLA. Sclera without injection or icterus. MMM. Erythematous oropharynx without exudate. Edematous and erythematous sinus passages. Neck: Supple. No LAD. Cardiac: Regular rate and rhythm. Normal S1/S2. No murmurs, rubs, or gallops appreciated. 2+ radial pulses. Lungs: Clear bilaterally to ascultation. No increased WOB, no accessory muscle usage. No w/r/r. Neuro: Alert and at baseline Ext: no edema Psych: Pleasant and appropriate   ASSESSMENT/PLAN:   Viral URI with cough Patient with most likely viral URI with cough. No red flag symptoms, clear pulmonary exam. Pain in ribs likely related to coughing, no use of cxr at this point with reassuring exam. Even with rib fx, cxr would not change management. Recommend tylenol, compression, heat/ice for pain. Recommend treating symptoms with flonase, second gen antihistamine, tessalon perles for cough. Given supportive care instructions and return precautions. Limited utility for more COVID testing after negative test yesterday.     , MD [facility]

## 2021-01-03 NOTE — Assessment & Plan Note (Addendum)
Patient with most likely viral URI with cough. No red flag symptoms, clear pulmonary exam. Pain in ribs likely related to coughing, no use of cxr at this point with reassuring exam. Even with rib fx, cxr would not change management. Recommend tylenol, compression, heat/ice for pain. Recommend treating symptoms with flonase, second gen antihistamine, tessalon perles for cough. Given supportive care instructions and return precautions. Limited utility for more COVID testing after negative test yesterday.

## 2021-01-03 NOTE — Patient Instructions (Addendum)
It was a pleasure to see you today!  You likely have a viral upper respiratory syndrome. Symptoms typically peak at 2-3 days of illness and then gradually improve over 10-14 days. However, a cough may last 3-8 weeks.  Please stay hydrated. Eating warm liquids such as chicken soup or tea may also help with nasal congestion. You can use saline rinse for nasal congestion, I recommend this at night prior to bed time Also flonase will help reduce nasal congestion and swelling, but works best when used for 1-6 weeks. I would start this now, but in future start using about 3 weeks before you typically get allergy symptoms For cough you can use benzonatate (tessalon perles) twice a day. You can also use warm fluids for sore throat, honey, or humidifer. You can also try camomile or peppermint tea. I also recommend getting the COVID booster when you feel better Please call us back if you: Cannot drink anything for a prolonged period Having difficulty breathing, working hard to breathe, or breathing rapidly Have a fever greater than 100.70F   Feel better! Dr. Leary Roca

## 2021-03-11 ENCOUNTER — Encounter: Payer: Self-pay | Admitting: Family Medicine

## 2021-03-11 ENCOUNTER — Other Ambulatory Visit: Payer: Self-pay

## 2021-03-11 ENCOUNTER — Ambulatory Visit: Payer: 59 | Admitting: Family Medicine

## 2021-03-11 VITALS — BP 122/68 | HR 73 | Ht 62.0 in | Wt 105.0 lb

## 2021-03-11 DIAGNOSIS — J302 Other seasonal allergic rhinitis: Secondary | ICD-10-CM

## 2021-03-11 DIAGNOSIS — I10 Essential (primary) hypertension: Secondary | ICD-10-CM

## 2021-03-11 NOTE — Progress Notes (Signed)
    SUBJECTIVE:   CHIEF COMPLAINT / HPI:   HTN folllow up Current regimen: Valsartan-HCTZ 80-12.5mg  daily (changed from telmisartan-HCTZ at last visit) Checks BP regularly at home, readings have been 110s-120s systolic, 70s diastolic. Only 2 readings above 140 systolic and 2 lower readings in the 100s systolic. She does pay attention to sodium intake Ideally would like to get off BP medication in the future if possible Last BMP 07/02/2020- normal  Cough Patient complains of cough that occurs during summer months, mostly in the mornings, associated with sneezing, clear nasal drainage, and patient states she feels post nasal drip. No itchy eyes. Took loratidine for 1 month but didn't notice any significant improvement so she stopped.  Bump on Left Side Patient recently noticed a small but hard bump on her left lower rib cage. Overall asymptomatic but sometimes hurts with coughing.   PERTINENT  PMH / PSH: HTN, HSV  OBJECTIVE:   BP 122/68   Pulse 73   Ht 5\' 2"  (1.575 m)   Wt 105 lb (47.6 kg)   SpO2 98%   BMI 19.20 kg/m   General: NAD, pleasant, able to participate in exam Respiratory: normal effort, CTAB Extremities: no edema or cyanosis. MSK: 38mm hard nodule on L anterior 10th rib, nontender to palpation Skin: warm and dry, no rashes noted Neuro: alert, no obvious focal deficits Psych: Normal affect and mood   ASSESSMENT/PLAN:   Essential hypertension Chronic, very well controlled on current meds (valsartan-HCTZ 80-12.5mg  daily). Last BMP wnl in Dec 2021.  -Continue current regimen -Follow up in 3 months -Recheck BMP at next appointment -Patient to continue monitoring BP at home -She ideally wants to get off medication if possible. Could consider trial of single medication (valsartan or HCTZ alone) if BP remains very well controlled at next visit  Seasonal allergies Patient's cough likely related to seasonal allergies as it tends to occur in the summertime and is worse  in the morning. Associated with sneezing and post-nasal drip. Unlikely GERD or infectious etiology at this time. -Continue Loratidine, flonase, and saline nasal spray   Bump on Ribs Patient with very small, firm nodule in the region of her left anterior 10th rib (approximately). Most likely benign as it is very small, asymptomatic, and feels bony in nature. Do not feel there would be any intervention/treatment (medication or procedural), therefore will not pursue additional workup at this time. If enlarging or becoming symptomatic, return for re-evaluation.   Jan 2022, MD [facility]

## 2021-03-11 NOTE — Patient Instructions (Addendum)
It was great to see you!  Things we discussed at today's visit: - Continue taking your Valsartan-HCTZ daily.  - Keep checking your blood pressures regularly. Bring a written log to your next appointment. If you have concerns about consistently high or low readings, please call our office or send a MyChart message - We will check your labs at your next visit  - Continue taking the Loratidine daily for allergies. You can also continue using Flonase and Saline spray regularly  Take care and seek immediate care sooner if you develop any concerns.  Dr. [facility]

## 2021-03-11 NOTE — Assessment & Plan Note (Signed)
Patient's cough likely related to seasonal allergies as it tends to occur in the summertime and is worse in the morning. Associated with sneezing and post-nasal drip. Unlikely GERD or infectious etiology at this time. -Continue Loratidine, flonase, and saline nasal spray

## 2021-03-11 NOTE — Assessment & Plan Note (Signed)
Chronic, very well controlled on current meds (valsartan-HCTZ 80-12.5mg  daily). Last BMP wnl in Dec 2021.  -Continue current regimen -Follow up in 3 months -Recheck BMP at next appointment -Patient to continue monitoring BP at home -She ideally wants to get off medication if possible. Could consider trial of single medication (valsartan or HCTZ alone) if BP remains very well controlled at next visit

## 2021-04-24 ENCOUNTER — Ambulatory Visit (INDEPENDENT_AMBULATORY_CARE_PROVIDER_SITE_OTHER): Payer: 59 | Admitting: Family Medicine

## 2021-04-24 ENCOUNTER — Other Ambulatory Visit: Payer: Self-pay

## 2021-04-24 VITALS — BP 145/70 | HR 91

## 2021-04-24 DIAGNOSIS — R053 Chronic cough: Secondary | ICD-10-CM

## 2021-04-24 MED ORDER — BENZONATATE 100 MG PO CAPS: 100.0000 mg | ORAL_CAPSULE | Freq: Two times a day (BID) | ORAL | 0 refills | Status: DC | PRN

## 2021-04-24 MED ORDER — AMOXICILLIN-POT CLAVULANATE 875-125 MG PO TABS: 1.0000 | ORAL_TABLET | Freq: Two times a day (BID) | ORAL | 0 refills | Status: AC

## 2021-04-24 NOTE — Progress Notes (Signed)
    SUBJECTIVE:   CHIEF COMPLAINT / HPI:   Cough, congestion Patient presents for a persistent cough which she reports started in the summer but has been continuing since her last visit on 8/16.  She has been taking antihistamines as prescribed as well as using Flonase and nasal saline.  She reports that she is now having yellow congestion which has been persistent for the last 3 weeks or more.  Denies any shortness of breath or chest pain.  Denies any fever, chills.  Is concerned that this may be bacterial infection.   OBJECTIVE:   BP (!) 145/70   Pulse 91   SpO2 97%   General: Well-appearing 60 year old female, no acute distress Cardiac: Regular rate and rhythm, no murmurs appreciated HEENT: Moist mucous membranes, no rhinorrhea noted, swelling of nasal turbinates with mild erythema, mild erythema of posterior oropharynx Respiratory: Normal work of breathing, lungs clear to auscultation bilaterally, patient does cough during the evaluation and it is a deep congested cough although not appreciated on auscultation Abdomen: Soft, nontender MSK: No gross abnormalities  ASSESSMENT/PLAN:   Persistent cough Persistent cough with congestion for over a month.  Congestion has worsened and is now yellow.  May be worsening seasonal allergies but could also be bacterial in origin.  Have prescribed 1 week of antibiotics and Tessalon Perles for the cough.  Recommend the patient continues antihistamine use as well as Flonase.  Strict return precautions given.     Derrel Nip, MD [facility]

## 2021-04-24 NOTE — Patient Instructions (Signed)
It was wonderful seeing you today.  I am sorry you are still having issues with his cough.  I have sent a prescription to your pharmacy for some Augmentin as well as some Tessalon Perles which should help with the cough.  I would like for you to switch from the loratadine to cetirizine for antihistamine medications and continue to use the Flonase.  If your symptoms worsen, you have shortness of breath, you have chest pain please seek medical attention at the emergency department.  I hope you have a wonderful afternoon!

## 2021-04-26 DIAGNOSIS — R053 Chronic cough: Secondary | ICD-10-CM

## 2021-04-26 HISTORY — DX: Chronic cough: R05.3

## 2021-04-26 NOTE — Assessment & Plan Note (Signed)
Persistent cough with congestion for over a month.  Congestion has worsened and is now yellow.  May be worsening seasonal allergies but could also be bacterial in origin.  Have prescribed 1 week of antibiotics and Tessalon Perles for the cough.  Recommend the patient continues antihistamine use as well as Flonase.  Strict return precautions given.

## 2021-05-01 ENCOUNTER — Other Ambulatory Visit: Payer: Self-pay | Admitting: Family Medicine

## 2021-05-01 DIAGNOSIS — Z1231 Encounter for screening mammogram for malignant neoplasm of breast: Secondary | ICD-10-CM

## 2021-05-28 ENCOUNTER — Other Ambulatory Visit: Payer: Self-pay

## 2021-05-28 ENCOUNTER — Ambulatory Visit
Admission: RE | Admit: 2021-05-28 | Discharge: 2021-05-28 | Disposition: A | Discharge status: Home / Self Care | Payer: 59 | Source: Ambulatory Visit | Attending: Podiatry | Admitting: Podiatry

## 2021-05-28 DIAGNOSIS — Z1231 Encounter for screening mammogram for malignant neoplasm of breast: Secondary | ICD-10-CM

## 2021-06-07 ENCOUNTER — Other Ambulatory Visit: Payer: Self-pay | Admitting: Family Medicine

## 2021-06-07 DIAGNOSIS — I10 Essential (primary) hypertension: Secondary | ICD-10-CM

## 2021-09-23 ENCOUNTER — Other Ambulatory Visit: Payer: Self-pay

## 2021-09-23 ENCOUNTER — Ambulatory Visit (INDEPENDENT_AMBULATORY_CARE_PROVIDER_SITE_OTHER): Payer: BC Managed Care – PPO | Admitting: Family Medicine

## 2021-09-23 VITALS — BP 125/79 | HR 77 | Ht 62.0 in | Wt 105.0 lb

## 2021-09-23 DIAGNOSIS — S6992XA Unspecified injury of left wrist, hand and finger(s), initial encounter: Secondary | ICD-10-CM | POA: Diagnosis not present

## 2021-09-23 DIAGNOSIS — Z23 Encounter for immunization: Secondary | ICD-10-CM

## 2021-09-23 NOTE — Progress Notes (Signed)
° °  SUBJECTIVE:   CHIEF COMPLAINT / HPI:   Tasha Nelson is a 61 y.o. female here after injuring herself with a session.  Yesterday.  States that she was trying to put the pen on her scarf and the pin went through the top of her left index finger.  States that she had some brisk bleeding that was controlled quickly.  Denies current pain today.  She was unsure of her last tetanus shot so she decided to come in.   PERTINENT  PMH / PSH: reviewed and updated as appropriate   OBJECTIVE:   BP 125/79    Pulse 77    Ht 5\' 2"  (1.575 m)    Wt 105 lb (47.6 kg)    SpO2 98%    BMI 19.20 kg/m    GEN: well appearing female in no acute distress  CVS: well perfused  RESP: speaking in full sentences without pause, no respiratory distress  MSK: Normal range of motion of left index finger, entry and exit point identified, no bleeding or hematoma, brisk cap refill, sensation intact grossly SKIN: Warm, well-perfused  ASSESSMENT/PLAN:   Left index finger injury Tetanus updated today.  Overall exam unremarkable.  Return precautions provided and patient voiced understanding.  Healthcare maintenance Influenza vaccine provided Declines shingles vaccine at this time Discussed need for colonoscopy and patient wanting to wait into her insurance is updated  , DO PGY-3, [facility] 09/23/2021

## 2021-12-06 ENCOUNTER — Other Ambulatory Visit: Payer: Self-pay | Admitting: Family Medicine

## 2021-12-06 DIAGNOSIS — I10 Essential (primary) hypertension: Secondary | ICD-10-CM

## 2021-12-30 ENCOUNTER — Encounter: Payer: Self-pay | Admitting: *Deleted

## 2022-01-14 ENCOUNTER — Ambulatory Visit: Payer: BC Managed Care – PPO | Admitting: Family Medicine

## 2022-01-14 ENCOUNTER — Encounter: Payer: Self-pay | Admitting: Family Medicine

## 2022-01-14 DIAGNOSIS — L309 Dermatitis, unspecified: Secondary | ICD-10-CM

## 2022-01-14 HISTORY — DX: Dermatitis, unspecified: L30.9

## 2022-01-14 MED ORDER — HYDROCORTISONE 1 % EX OINT: 1.0000 | TOPICAL_OINTMENT | Freq: Two times a day (BID) | CUTANEOUS | 0 refills | Status: DC

## 2022-01-14 NOTE — Patient Instructions (Signed)
Thank you for coming to see me today. It was a pleasure. Today we discussed your nipple changes. It could be a dermatitis of the nipple. I recommend hydrocortisone cream to the area. Avoid irritating soaps/detergents to the area  Please follow-up with [place] if no improvement in symptoms   If you have any questions or concerns, please do not hesitate to call the office at [phone].  Best wishes,   Dr Allena Katz

## 2022-01-14 NOTE — Progress Notes (Signed)
     SUBJECTIVE:   CHIEF COMPLAINT / HPI:   Tasha Nelson is a 61 y.o. female presents for nipple concerns  Nipple concerns Pt reports left sided nipple pruritus for the last week. No new recent changes in soap, changes in bedsheets. Denies nipple pain, discharge, breast lump etc. May have injured her breasts when pushing against her law mower. Last mammogram in Nov 22 which was normal. No personal history or family history of breast cancer.   Flowsheet Row Office Visit from 01/14/2022 in [facility]  PHQ-9 Total Score 0        Health Maintenance Due  Topic   COLONOSCOPY (Pts 45-3yrs Insurance coverage will need to be confirmed)    Zoster Vaccines- Shingrix (1 of 2)   COVID-19 Vaccine (3 Chief Strategy Officer)      PERTINENT  PMH / PSH: HTN, genital herpes, seasonal allergies   OBJECTIVE:   BP (!) 142/85   Pulse 72   Ht 5\' 2"  (1.575 m)   Wt 107 lb 12.8 oz (48.9 kg)   SpO2 100%   BMI 19.72 kg/m    General: Alert, no acute distress Cardio: well perfused  Pulm: normal work of breathing Neuro: Cranial nerves grossly intact   Breast exam chaperoned by CMA April  Breasts: breasts appear normal, no suspicious masses, no skin changes or axillary nodes. Inverted left nipple  ASSESSMENT/PLAN:   Nipple dermatitis Most likely nipple dermatitis. Normal breast exam except slight inversion of left nipple. Very low suspicion for malignancy given lack of masses/skin changes on exam. Mammogram up to date and normal. Recommended hydrocortisone ointment to apply to the area. Follow up if no improvement in symptoms. Could trial antifungal ointment if no improvement.    May, MD PGY-3 [facility]

## 2022-01-15 NOTE — Assessment & Plan Note (Signed)
Most likely nipple dermatitis. Normal breast exam except slight inversion of left nipple. Very low suspicion for malignancy given lack of masses/skin changes on exam. Mammogram up to date and normal. Recommended hydrocortisone ointment to apply to the area. Follow up if no improvement in symptoms. Could trial antifungal ointment if no improvement.

## 2022-03-10 ENCOUNTER — Telehealth: Payer: Self-pay | Admitting: Family Medicine

## 2022-03-10 DIAGNOSIS — I10 Essential (primary) hypertension: Secondary | ICD-10-CM

## 2022-03-10 NOTE — Telephone Encounter (Signed)
Patient came in stating that she needs her bp medicine refilled, she only has 2 left.

## 2022-03-11 MED ORDER — VALSARTAN-HYDROCHLOROTHIAZIDE 80-12.5 MG PO TABS: 1.0000 | ORAL_TABLET | Freq: Every day | ORAL | 0 refills | Status: DC

## 2022-03-11 NOTE — Telephone Encounter (Signed)
Refill sent on her valsartan-HCTZ. Patient has upcoming appt with Dr Marsh Dolly-- recommend discussing HTN and obtaining BMP at that visit.

## 2022-03-23 ENCOUNTER — Encounter: Payer: Self-pay | Admitting: Family Medicine

## 2022-03-23 ENCOUNTER — Ambulatory Visit (INDEPENDENT_AMBULATORY_CARE_PROVIDER_SITE_OTHER): Payer: BC Managed Care – PPO | Admitting: Family Medicine

## 2022-03-23 VITALS — BP 120/60 | HR 94 | Ht 62.0 in | Wt 107.0 lb

## 2022-03-23 DIAGNOSIS — I10 Essential (primary) hypertension: Secondary | ICD-10-CM | POA: Diagnosis not present

## 2022-03-23 DIAGNOSIS — A6000 Herpesviral infection of urogenital system, unspecified: Secondary | ICD-10-CM | POA: Diagnosis not present

## 2022-03-23 DIAGNOSIS — Z1211 Encounter for screening for malignant neoplasm of colon: Secondary | ICD-10-CM

## 2022-03-23 DIAGNOSIS — R0989 Other specified symptoms and signs involving the circulatory and respiratory systems: Secondary | ICD-10-CM

## 2022-03-23 NOTE — Assessment & Plan Note (Signed)
Continue taking valtrex when you  Notice a flare

## 2022-03-23 NOTE — Assessment & Plan Note (Signed)
Pulse not visible on exam, felt on palpation, size appears normal. No concern as exam is unremarkable, pt has never smoked and hypertension under control.  - Patient will continue to monitor at home and follow up if necessary.

## 2022-03-23 NOTE — Progress Notes (Signed)
    SUBJECTIVE:   CHIEF COMPLAINT / HPI:   Rib Soreness Has been about 6 months since she was sick and had a persistent cough. Has been feeling rib symptoms for about 3 months. Not pain but feels uncomfortable. Does not feel it when she's exercising. Sometimes she feels it when she stretches.   Pulse in Abdomen  No chest pain or abdominal pain, just noticed seeing a pulse in her upper abdomen Denies palpitations No history of smoking or tobacco  Takes her hypertension medication daily    Health Maintenance  Has been trying to schedule her colonoscopy but had trouble  Denies lightheadedness with her hypertension medication  IS trying to incorporate strength exercises and protein shakes.   PERTINENT  PMH / PSH: HTN, Chronic back pain   OBJECTIVE:   BP 120/60   Pulse 94   Ht 5\' 2"  (1.575 m)   Wt 107 lb (48.5 kg)   SpO2 98%   BMI 19.57 kg/m   General: Small thin older lady in good health  CV: RRR, Abdominal aorta pulse palpable, normal size, well perfused  Resp: CTAB, normal work of breathing on room air  Normal movement of ribs with breathing  Abd: soft, non tender, non distended   ASSESSMENT/PLAN:   Essential hypertension Very well controlled. No negative side effects.  - Continue current regimen of Diovan   Genital herpes simplex Continue taking valtrex when you  Notice a flare   Pulse visible in abdominal aorta Pulse not visible on exam, felt on palpation, size appears normal. No concern as exam is unremarkable, pt has never smoked and hypertension under control.  - Patient will continue to monitor at home and follow up if necessary.    Health Maintenance - Referral to GI for colon cancer screening colonoscopy    , MD [facility]

## 2022-03-23 NOTE — Patient Instructions (Signed)
It was great to see you today! Thank you for choosing [facility] for your primary care. Tasha Nelson was seen for rib soreness.  Today we addressed: Floating rib - this is not concerning. You may feel your rib move more when you are doing some deep breathing or exercise.  Pulse in abdomen - This is also normal. Your exam showed that you had a normal sized abdominal aorta and pulse. Given that you have never smoked and your hypertension is well concerned, there is a low level of risk for any concerns regarding your abdominal aorta.  Hypertension - your hypertension is well controlled. If you get light headed on your current medication please let us know.  Health maintenance -I have put in a referral to GI for your colonoscopy. They should call you   If you haven't already, sign up for My Chart to have easy access to your labs results, and communication with your primary care physician.  We are checking some labs today. If they are abnormal, I will call you. If they are normal, I will send you a MyChart message (if it is active) or a letter in the mail. If you do not hear about your labs in the next 2 weeks, please call the office.   You should return to our clinic No follow-ups on file.  I recommend that you always bring your medications to each appointment as this makes it easy to ensure you are on the correct medications and helps [place] not miss refills when you need them.  Please arrive 15 minutes before your appointment to ensure smooth check in process.  We appreciate your efforts in making this happen.  Please call the clinic at [phone] if your symptoms worsen or you have any concerns.  Thank you for allowing me to participate in your care, Lockie Mola, MD 03/23/2022, 4:39 PM PGY-1, [facility]

## 2022-03-23 NOTE — Assessment & Plan Note (Signed)
Very well controlled. No negative side effects.  - Continue current regimen of Diovan

## 2022-04-23 IMAGING — MG MM DIGITAL SCREENING BILAT W/ TOMO AND CAD
8 series · 9 of 24 positions shown · non-contrast
Comparison: Previous exam(s).

CLINICAL DATA: Screening.

EXAM:
DIGITAL SCREENING BILATERAL MAMMOGRAM WITH TOMOSYNTHESIS AND CAD
TECHNIQUE: Bilateral screening digital craniocaudal and mediolateral oblique
mammograms were obtained. Bilateral screening digital breast
tomosynthesis was performed. The images were evaluated with
computer-aided detection.

[R CC synth-2D]
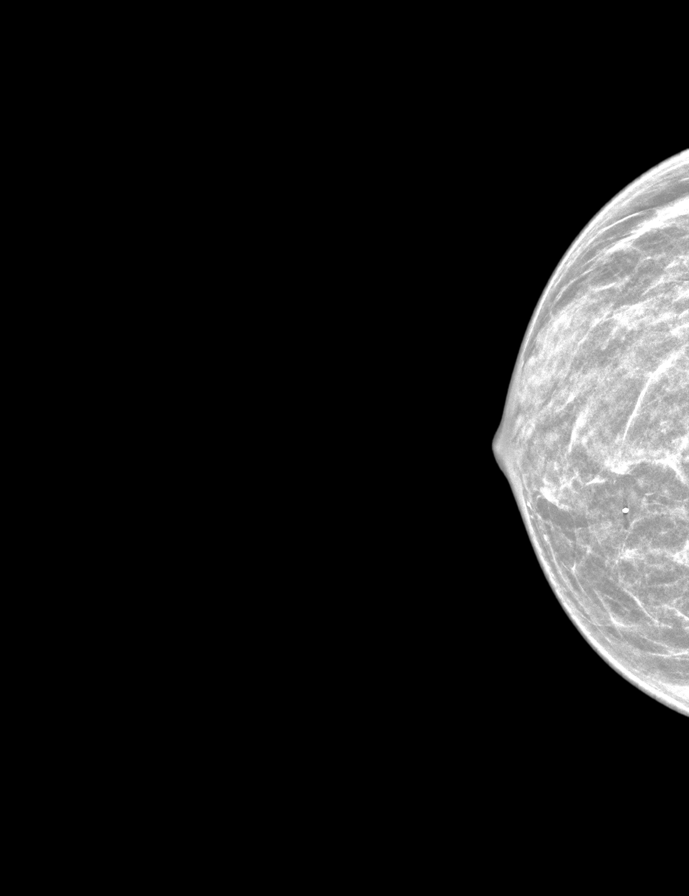

[L CC synth-2D]
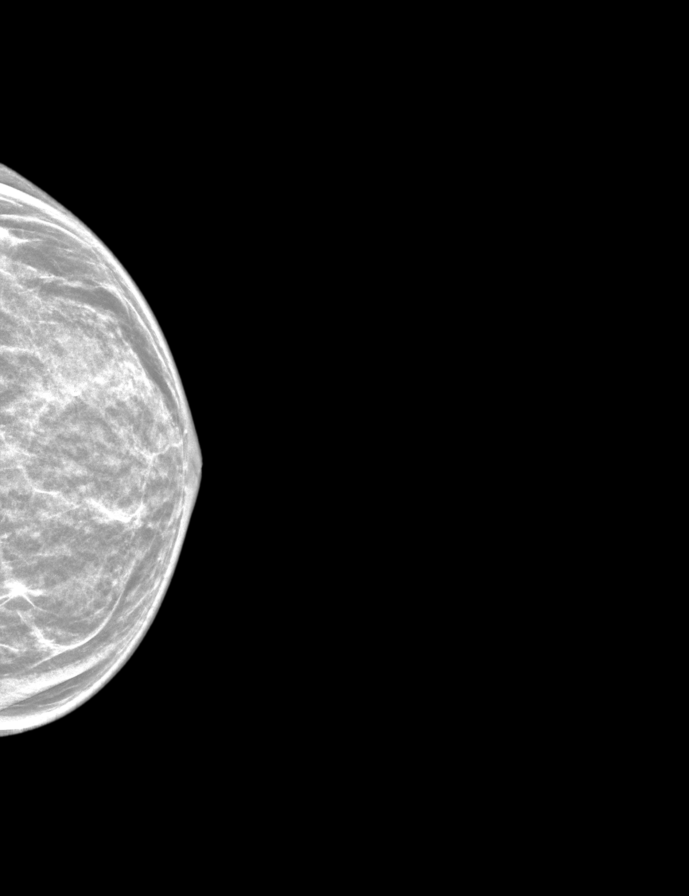

[L MLO synth-2D]
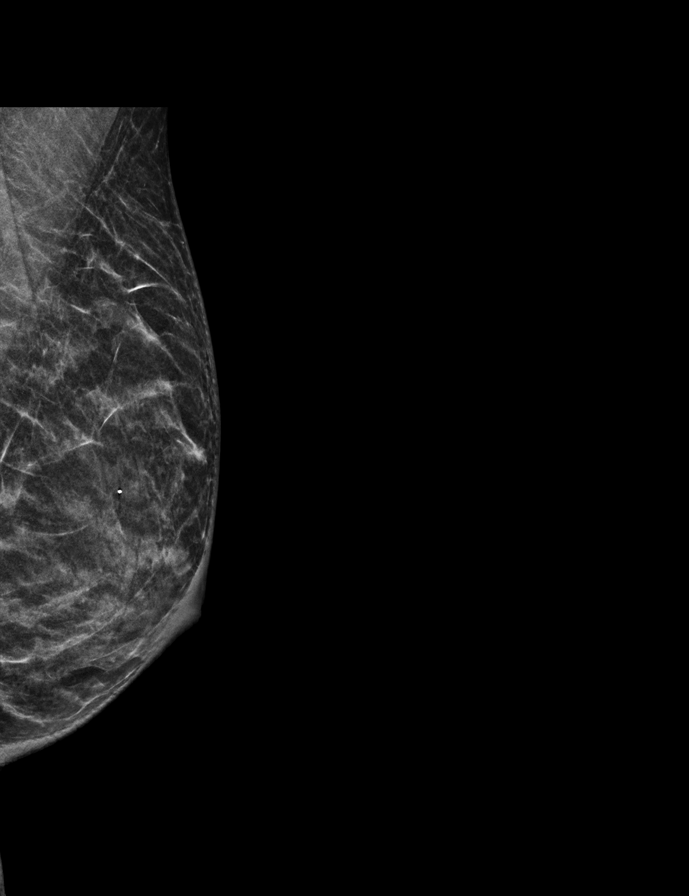

[R MLO synth-2D]
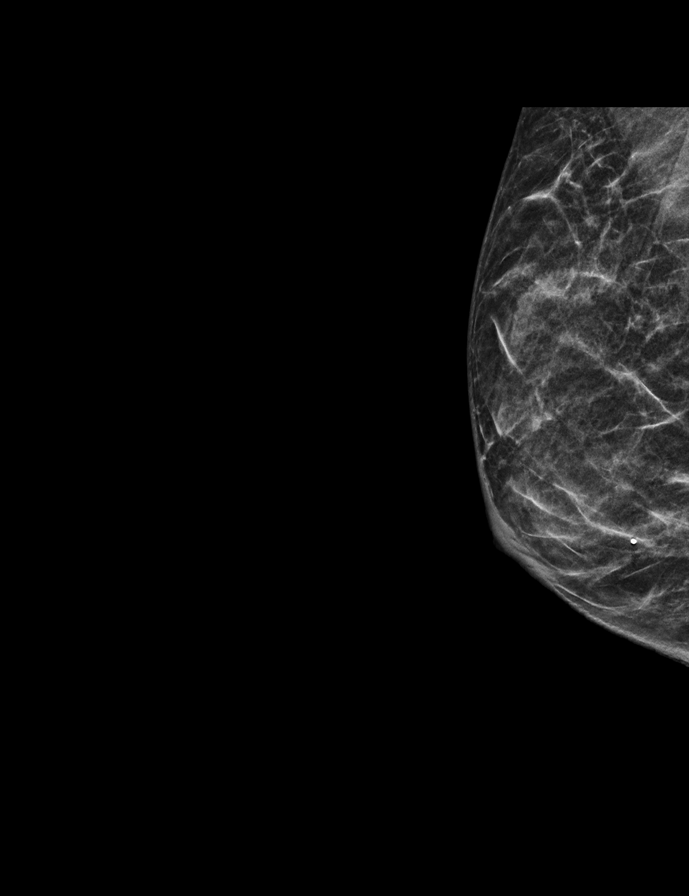

[L MLO tomo · 2 of 40 frames shown]
[frame 13/40]
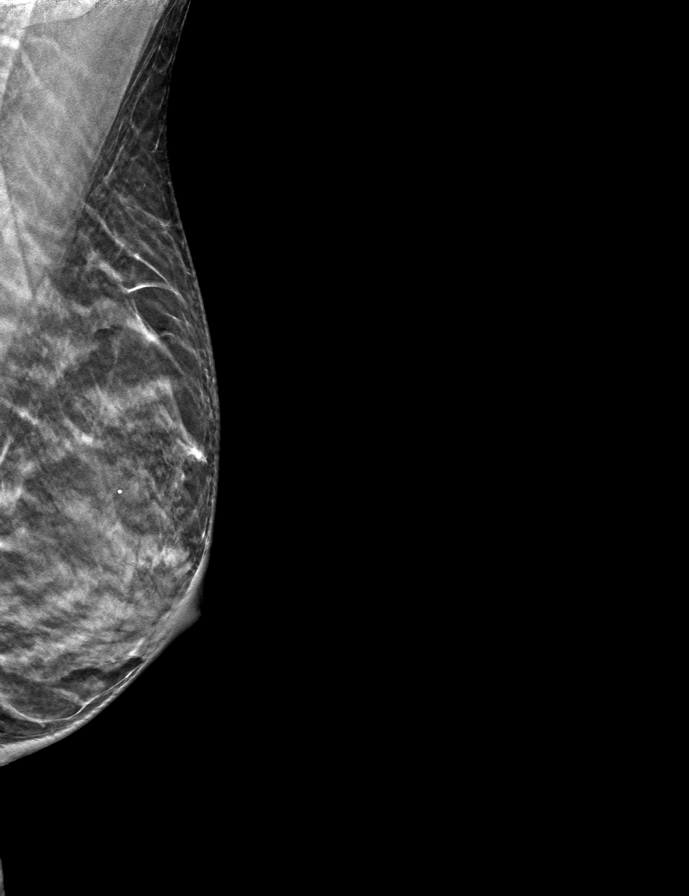
[frame 21/40]
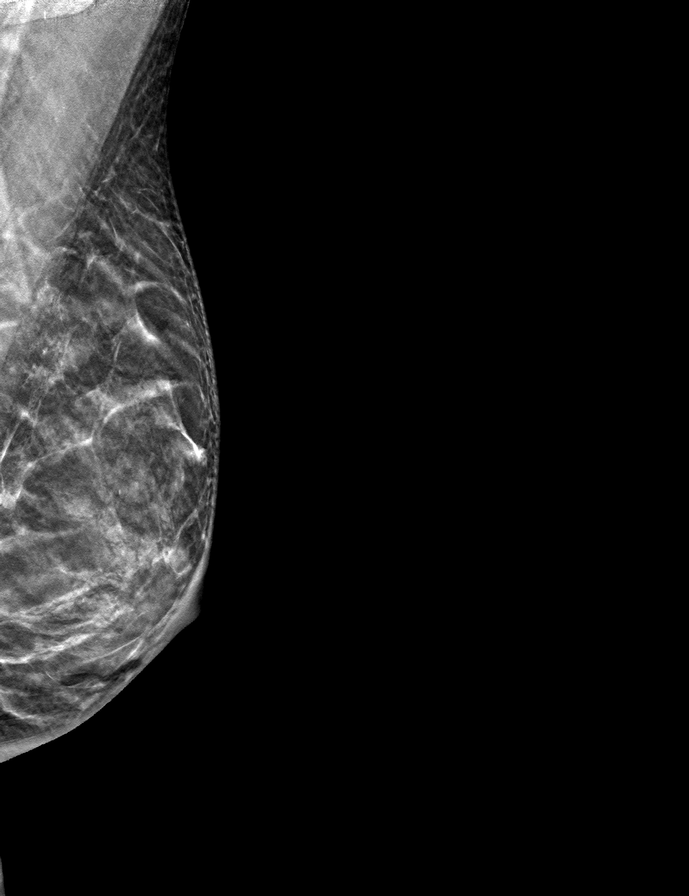

[R CC tomo · tomo slice 19/37.0]
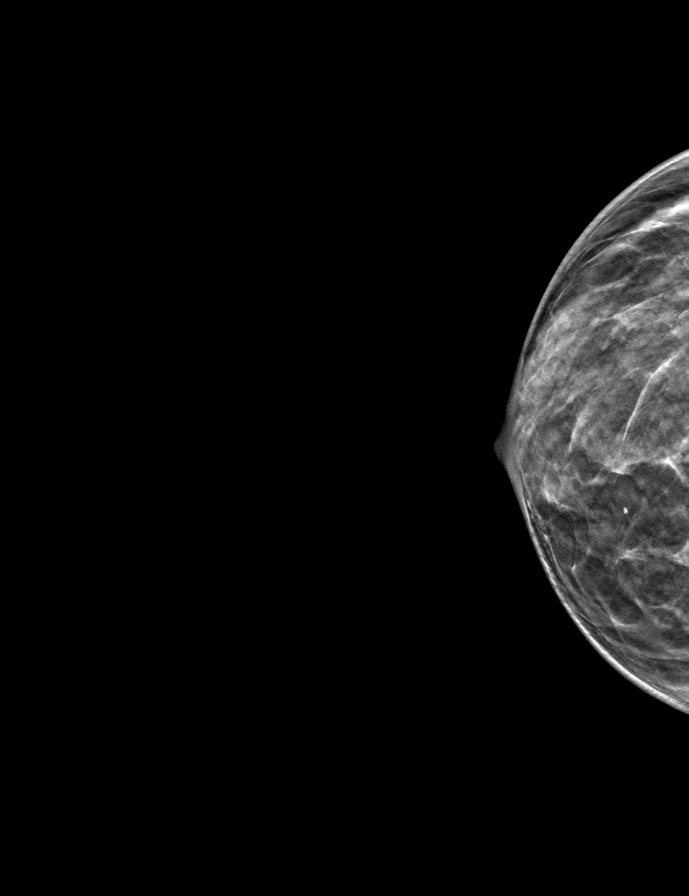

[L CC tomo · tomo slice 19/37.0]
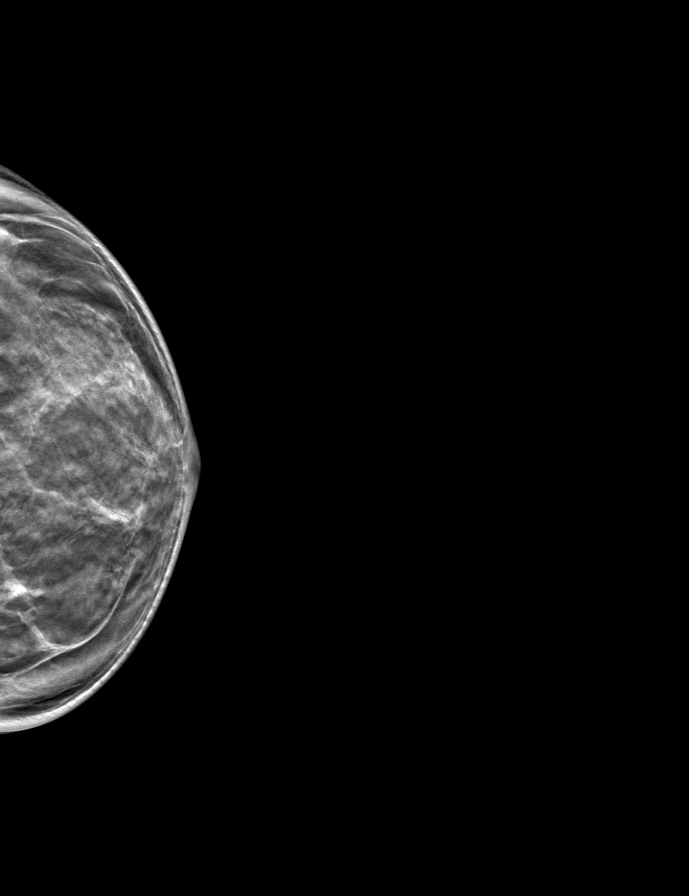

[R MLO tomo · tomo slice 19/38.0]
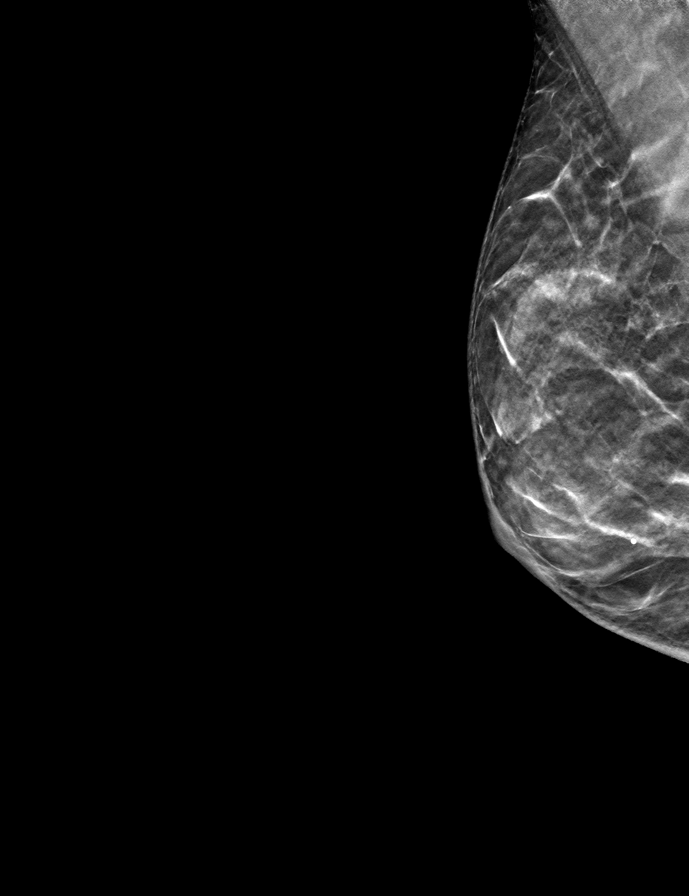

[9 of 24 positions shown; findings below may reference images not displayed]

ACR Breast Density Category c: The breast tissue is heterogeneously
dense, which may obscure small masses.
FINDINGS: There are no findings suspicious for malignancy.
IMPRESSION: No mammographic evidence of malignancy. A result letter of this
screening mammogram will be mailed directly to the patient.

RECOMMENDATION:
Screening mammogram in one year. (Code:[id number]-BC3)

BI-RADS CATEGORY  1: Negative.

## 2022-05-05 ENCOUNTER — Ambulatory Visit (INDEPENDENT_AMBULATORY_CARE_PROVIDER_SITE_OTHER): Payer: BC Managed Care – PPO | Admitting: Family Medicine

## 2022-05-05 ENCOUNTER — Encounter: Payer: Self-pay | Admitting: Family Medicine

## 2022-05-05 ENCOUNTER — Ambulatory Visit
Admission: RE | Admit: 2022-05-05 | Discharge: 2022-05-05 | Disposition: A | Discharge status: Home / Self Care | Payer: BC Managed Care – PPO | Source: Ambulatory Visit | Attending: Family Medicine | Admitting: Family Medicine

## 2022-05-05 VITALS — BP 130/70 | HR 105 | Wt 102.0 lb

## 2022-05-05 DIAGNOSIS — J302 Other seasonal allergic rhinitis: Secondary | ICD-10-CM | POA: Diagnosis not present

## 2022-05-05 DIAGNOSIS — R051 Acute cough: Secondary | ICD-10-CM

## 2022-05-05 DIAGNOSIS — Z13220 Encounter for screening for lipoid disorders: Secondary | ICD-10-CM | POA: Diagnosis not present

## 2022-05-05 DIAGNOSIS — I10 Essential (primary) hypertension: Secondary | ICD-10-CM | POA: Diagnosis not present

## 2022-05-05 MED ORDER — VALACYCLOVIR HCL 500 MG PO TABS: ORAL_TABLET | ORAL | 1 refills | Status: DC

## 2022-05-05 MED ORDER — VALSARTAN-HYDROCHLOROTHIAZIDE 80-12.5 MG PO TABS: 1.0000 | ORAL_TABLET | Freq: Every day | ORAL | 3 refills | Status: AC

## 2022-05-05 MED ORDER — FLUTICASONE PROPIONATE 50 MCG/ACT NA SUSP: 1.0000 | Freq: Every day | NASAL | 2 refills | Status: AC

## 2022-05-05 NOTE — Progress Notes (Unsigned)
    SUBJECTIVE:   CHIEF COMPLAINT / HPI:   Hypertension: Patient is a 61 y.o. female who presents today for HTN follow-up.  Home medications include: valsartan-HCTZ 80-12.5mg  Patient reports overall very good compliance-- missed dose once every month  Patient does check blood pressure at home. 130s/70-80s  Patient has not had a BMP in the past 1 year.  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     Alcus Dad, MD [place]

## 2022-05-05 NOTE — Patient Instructions (Signed)
It was great to see you!  Things we discussed at today's visit: - I have sent refills on your medications - We are checking some labs (kidney function, electrolytes, cholesterol) - I have ordered a chest x-ray. You can go to [place] ([place] or [place]) any time between 7am-7pm to have this done. No appointment needed.  - I will send you a MyChart message or call with the results   Take care and seek immediate care sooner if you develop any concerns.  Dr. [facility]

## 2022-05-06 ENCOUNTER — Telehealth: Payer: Self-pay

## 2022-05-06 LAB — LIPID PANEL
Chol/HDL Ratio: 3.4 ratio (ref 0.0–4.4)
Cholesterol, Total: 134 mg/dL (ref 100–199)
HDL: 39 mg/dL — ABNORMAL LOW (ref >39)
LDL Chol Calc (NIH): 80 mg/dL (ref 0–99)
Triglycerides: 78 mg/dL (ref 0–149)
VLDL Cholesterol Cal: 15 mg/dL (ref 5–40)

## 2022-05-06 LAB — BASIC METABOLIC PANEL
BUN/Creatinine Ratio: 32 — ABNORMAL HIGH (ref 12–28)
BUN: 22 mg/dL (ref 8–27)
CO2: 22 mmol/L (ref 20–29)
Calcium: 8.8 mg/dL (ref 8.7–10.3)
Chloride: 98 mmol/L (ref 96–106)
Creatinine, Ser: 0.68 mg/dL (ref 0.57–1.00)
Glucose: 96 mg/dL (ref 70–99)
Potassium: 4 mmol/L (ref 3.5–5.2)
Sodium: 138 mmol/L (ref 134–144)
eGFR: 98 mL/min/{1.73_m2} (ref >59)

## 2022-05-06 MED ORDER — AMOXICILLIN-POT CLAVULANATE 875-125 MG PO TABS: 1.0000 | ORAL_TABLET | Freq: Two times a day (BID) | ORAL | 0 refills | Status: DC

## 2022-05-06 NOTE — Telephone Encounter (Signed)
Returned patient's call to discuss.  Left VM informing her I have sent antibiotics to her pharmacy and she can call with any questions.  I will also try to reach her again this afternoon.   Alcus Dad, MD PGY-3, [place]

## 2022-05-06 NOTE — Assessment & Plan Note (Signed)
BP at goal. -Continue valsartan-HCTZ 80-12.5mg  daily. Refills sent -Obtain updated BMP today -Continue home BP monitoring

## 2022-05-06 NOTE — Telephone Encounter (Signed)
Attempted to call patient again to follow up. No answer.  Will send MyChart message informing of small pneumonia seen on CXR and antibiotics have been sent to her pharmacy.   Alcus Dad, MD PGY-3, [place]

## 2022-05-06 NOTE — Telephone Encounter (Signed)
Patient calls nurse line this morning regarding continued fever. She reports that most recent temp this AM was 102 orally. She has taken a cold medication with acetaminophen this AM around 0630.   Patient was unsure if she needs to be seen again or if antibiotic can be started. She also reports that she is coughing up dark green sputum.   Will forward to Dr. Rock Nephew for further advisement.   Talbot Grumbling, RN

## 2022-06-08 ENCOUNTER — Other Ambulatory Visit: Payer: Self-pay | Admitting: Family Medicine

## 2022-06-08 DIAGNOSIS — Z1231 Encounter for screening mammogram for malignant neoplasm of breast: Secondary | ICD-10-CM

## 2022-07-09 ENCOUNTER — Ambulatory Visit
Admission: RE | Admit: 2022-07-09 | Discharge: 2022-07-09 | Disposition: A | Discharge status: Home / Self Care | Payer: BC Managed Care – PPO | Source: Ambulatory Visit | Attending: Podiatry | Admitting: Podiatry

## 2022-07-09 ENCOUNTER — Ambulatory Visit: Payer: BC Managed Care – PPO

## 2022-07-09 DIAGNOSIS — Z1231 Encounter for screening mammogram for malignant neoplasm of breast: Secondary | ICD-10-CM

## 2022-09-24 ENCOUNTER — Encounter: Payer: Self-pay | Admitting: Family Medicine

## 2022-09-24 ENCOUNTER — Ambulatory Visit (INDEPENDENT_AMBULATORY_CARE_PROVIDER_SITE_OTHER): Payer: BC Managed Care – PPO | Admitting: Family Medicine

## 2022-09-24 VITALS — BP 124/76 | HR 84 | Ht 62.0 in | Wt 106.8 lb

## 2022-09-24 DIAGNOSIS — Z0000 Encounter for general adult medical examination without abnormal findings: Secondary | ICD-10-CM | POA: Diagnosis not present

## 2022-09-24 DIAGNOSIS — R0781 Pleurodynia: Secondary | ICD-10-CM | POA: Diagnosis not present

## 2022-09-24 DIAGNOSIS — R9389 Abnormal findings on diagnostic imaging of other specified body structures: Secondary | ICD-10-CM | POA: Diagnosis not present

## 2022-09-24 NOTE — Progress Notes (Signed)
    SUBJECTIVE:   Chief complaint/HPI: annual examination  Tasha Nelson is a 62 y.o. who presents today for an annual exam.   Concerns today: hard bony lump under left ribs-- previously told it was floating rib, but she's worried about it. Not painful. Not enlarging that she's aware of.   History tabs reviewed and updated.   Review of systems form reviewed and notable for: Concerned about her weight-- states other people think she's sick because she's thin. Wondering if she should add protein shakes.   OBJECTIVE:   BP 124/76   Pulse 84   Ht '5\' 2"'$  (1.575 m)   Wt 106 lb 12.8 oz (48.4 kg)   SpO2 98%   BMI 19.53 kg/m   Gen: NAD, pleasant, able to participate in exam HEENT: [place]/AT, PERRLA, nares patent bilaterally, TM normal bilaterally, oropharynx unremarkable Neck: supple, no cervical or supraclavicular lymphadenopathy, thyroid smooth and normal in size CV: RRR, normal S1/S2, no murmur Resp: Normal effort, lungs CTAB GI: abdomen soft, non-tender, non-distended Extremities: no edema or cyanosis Skin: warm and dry, no rashes noted Neuro: alert, no obvious focal deficits Psych: Normal affect and mood MSK: hard, bony nodule (~4m) inferior to left anterior 10th rib, nontender to palpation  ASSESSMENT/PLAN:   Annual Examination  See AVS for age appropriate recommendations  PHQ score 0, reviewed and discussed.  BP reviewed and at goal.  Advance directives discussion: sister PMardene Celestewould be decision-maker, wants to be full code at the present time. Blue packet given today  Considered the following items based upon USPSTF recommendations: Diabetes screening: not indicated Screening for elevated cholesterol: done 4 months ago-- no indication for repeat testing today HIV testing: negative 07/02/2020, no indication for repeat testing Hepatitis C: negative 07/02/2020, no indication for repeat today Syphilis if at high risk: not at high risk GC/CT not at high risk and not  ordered. Osteoporosis screening considered based upon risk of fracture [facility]. Major osteoporotic fracture risk is 9.4%. DEXA not ordered.  Reviewed risk factors for latent tuberculosis and not indicated  Discussed family history, BRCA testing not indicated.  Cervical cancer screening: prior Pap reviewed, repeat due in 2026 Breast cancer screening: up-to-date, normal mammo 07/09/2022 Colorectal cancer screening: discussed. Previously referred to GI-- given info in AVS to call and schedule Lung cancer screening: not indicated  Abnormal chest x-ray CXR from 05/05/22 showed consolidation likely 2/2 pneumonia but follow up CXR was recommended to ensure resolution and this has not been completed. F/u CXR ordered today.   Rib Complaint Hard nodule inferior to left anterior ribs, likely area of calcification, possibly calcification of costal cartilage. Will obtain dedicated left rib x-ray to evaluate further.  Weight Concern Patient wondering if she's underweight. BMI within normal range and weight has been stable since at least 2018. No changes necessary but ok to drink protein shakes between meals if she wishes.  Follow up in 1 year or sooner if indicated.    AAlcus Dad MD [place]

## 2022-09-24 NOTE — Patient Instructions (Addendum)
It was great to see you!  Things we discussed today: -We will check a chest x-ray and rib x-ray. You can go to [place] ([place]) any time between 7am-7pm to have this done. No appointment needed.  -You are due for a colonoscopy. Please call [facility] at [phone] and choose option 1, to get this appointment scheduled.  -Please complete the blue Advanced Directives packet and return to our office at your earliest convenience. You can sign the 2nd to last page when you return it and we will notarize here. -You are at a healthy weight and BMI. Your weight has been stable since 2018. However, if you do wish to incorporate more calories and are having trouble doing this through food, it's perfectly fine to drink Ensure or Boost twice daily between meals.  Take care, Dr Rock Nephew  Things to do to keep yourself healthy  - Exercise at least 30-45 minutes a day, 3-4 days a week.  - Eat a diet with lots of fruits and vegetables (5 servings per day). - Avoid sugar sweetened beverages and processed foods whenever possible. - Seatbelts can save your life. Wear them always.  - Safe sex - use a condom to prevent STIs. - Alcohol -  If you drink, do it moderately, less than 2 drinks per day. - Sleep - aim for 7-8 hours nightly. - Limit screen time to no more than 2 hours per day.  - [place]. Choose someone to make decisions for you if you are not able.  - Depression is common in our stressful world. If you're feeling down or losing interest in things you normally enjoy, please come in for a visit.  - Violence - If anyone is threatening or hurting you, please call immediately.

## 2022-09-25 ENCOUNTER — Encounter: Payer: Self-pay | Admitting: Family Medicine

## 2022-09-25 DIAGNOSIS — R9389 Abnormal findings on diagnostic imaging of other specified body structures: Secondary | ICD-10-CM | POA: Insufficient documentation

## 2022-09-25 NOTE — Assessment & Plan Note (Signed)
CXR from 05/05/22 showed consolidation likely 2/2 pneumonia but follow up CXR was recommended to ensure resolution and this has not been completed. F/u CXR ordered today.

## 2022-10-19 ENCOUNTER — Ambulatory Visit
Admission: RE | Admit: 2022-10-19 | Discharge: 2022-10-19 | Disposition: A | Discharge status: Home / Self Care | Payer: BC Managed Care – PPO | Source: Ambulatory Visit | Attending: Family Medicine | Admitting: Family Medicine

## 2022-10-19 DIAGNOSIS — R0781 Pleurodynia: Secondary | ICD-10-CM

## 2022-10-19 DIAGNOSIS — R9389 Abnormal findings on diagnostic imaging of other specified body structures: Secondary | ICD-10-CM

## 2023-02-22 ENCOUNTER — Telehealth: Payer: Self-pay | Admitting: Student

## 2023-02-22 DIAGNOSIS — Z1211 Encounter for screening for malignant neoplasm of colon: Secondary | ICD-10-CM

## 2023-02-22 NOTE — Telephone Encounter (Signed)
Patient came in asking about getting a referral for a colonoscopy

## 2023-03-16 ENCOUNTER — Encounter: Payer: Self-pay | Admitting: Student

## 2023-03-16 ENCOUNTER — Ambulatory Visit: Payer: BC Managed Care – PPO | Admitting: Student

## 2023-03-16 VITALS — BP 136/87 | HR 68 | Ht 62.0 in | Wt 101.5 lb

## 2023-03-16 DIAGNOSIS — A6000 Herpesviral infection of urogenital system, unspecified: Secondary | ICD-10-CM

## 2023-03-16 DIAGNOSIS — I10 Essential (primary) hypertension: Secondary | ICD-10-CM

## 2023-03-16 MED ORDER — VALACYCLOVIR HCL 500 MG PO TABS: ORAL_TABLET | ORAL | 1 refills | Status: AC

## 2023-03-16 NOTE — Assessment & Plan Note (Signed)
Valtrex refilled.

## 2023-03-16 NOTE — Patient Instructions (Addendum)
It was great to see you! Thank you for allowing me to participate in your care!   Our plans for today:  - We will have you see Dr. Raymondo Band for an ambulatory blood pressure monitor to see whether you are having drops in BP that are causing this - Refilled your valtrex  Take care and seek immediate care sooner if you develop any concerns.  Levin Erp, MD

## 2023-03-16 NOTE — Progress Notes (Signed)
    SUBJECTIVE:   CHIEF COMPLAINT / HPI: Dizziness  HTN  Dizziness Sometimes when standing up feeling dizzy/lightheaded after taking BP med (valsartan-hydrochlorothiazide 80-12.5) Has been going on for a whille Sometimes takign every other other day/not taking blood pressure medicine daily due to this side effect Denies spinning of room sensation Drinking a good amount of water  Hx of HSV2 Needs refill of valtrex-had herpes outbreak after recent stressor  Rib Complaint Small nodule that is hard near left anterior ribs. Received dedicated rib XR with no acute issues. Sometimes bothers her-feels it when breathing in and out. Also when belt hits this area. No fevers, coughing, bloody sputum.  PERTINENT  PMH / PSH: reviewed  OBJECTIVE:   BP 136/87   Pulse 68   Ht 5\' 2"  (1.575 m)   Wt 101 lb 8 oz (46 kg)   SpO2 100%   BMI 18.56 kg/m    Orthostatic VS for the past 24 hrs:  BP- Lying Pulse- Lying BP- Sitting Pulse- Sitting BP- Standing at 0 minutes Pulse- Standing at 0 minutes  03/16/23 1630 147/87 68 151/90 73 149/82 72    General: Well appearing, NAD, awake, alert, responsive to questions Head: Normocephalic atraumatic CV: Regular rate and rhythm no murmurs rubs or gallops RespiratoryChest: Clear to ausculation bilaterally, no wheezes rales or crackles, chest rises symmetrically,  no increased work of breathing; small hard mobile mass near lower anterior left ribs   ASSESSMENT/PLAN:   Essential hypertension Patient's blood pressure is controlled today. BP: 136/87. Goal of 140/90. Patient's medication regimen includes (valsartan-hydrochlorothiazide 80-12.5). Negative orthostatic vitals today. -Referral to pharmacy clinic due to  symptoms of hypotension + medication nonadherance  - considering ambulatory BP monitor to see if having hypotension with the dizziness -Consider de-escalating to 1 agent such as valsartan alone  Genital herpes simplex Valtrex refilled   Rib  Complaint Recent rib XR negative. Could consider ultrasound of this area if persisting to be bother some. ?Calcification or cyst? No red flag symptoms currently. -Monitor  Levin Erp, MD [facility]

## 2023-03-16 NOTE — Assessment & Plan Note (Addendum)
Patient's blood pressure is controlled today. BP: 136/87. Goal of 140/90. Patient's medication regimen includes (valsartan-hydrochlorothiazide 80-12.5). Negative orthostatic vitals today. -Referral to pharmacy clinic due to  symptoms of hypotension + medication nonadherance  - considering ambulatory BP monitor to see if having hypotension with the dizziness -Consider de-escalating to 1 agent such as valsartan alone

## 2023-05-14 ENCOUNTER — Ambulatory Visit (INDEPENDENT_AMBULATORY_CARE_PROVIDER_SITE_OTHER): Payer: BC Managed Care – PPO | Admitting: Family Medicine

## 2023-05-14 VITALS — BP 154/89 | HR 85 | Ht 62.0 in | Wt 100.2 lb

## 2023-05-14 DIAGNOSIS — J069 Acute upper respiratory infection, unspecified: Secondary | ICD-10-CM

## 2023-05-14 DIAGNOSIS — I10 Essential (primary) hypertension: Secondary | ICD-10-CM | POA: Diagnosis not present

## 2023-05-14 NOTE — Assessment & Plan Note (Signed)
BP elevated today, but pt did not take meds this morning and is acutely ill. Will recheck BP once current illness resolves.

## 2023-05-14 NOTE — Assessment & Plan Note (Addendum)
1wk hx of congestion,cough, and fever. Afebrile x3 days now. VSS on RA. Differential includes viral URI, PNA, bronchitis, asthma, COPD. Viral URI is most likely given mild degree of symptoms, and symptoms resolving within ~1 week. PNA is considered given focal course lung sounds, but pt is improving without antibx. Asthma and COPD would not explain fever. - Continue conservative management for now. Coricidin prn for fever and discomfort.  - If still fevering by Monday, instructed to call back and consider sending antibx course for PNA at that time. Could also consider CXR if unsure.

## 2023-05-14 NOTE — Progress Notes (Signed)
    SUBJECTIVE:   CHIEF COMPLAINT / HPI:   Tasha Nelson is a 62yo F w/ hx of HTN that p/w congestion. - Starting Thursday (~1 wk ago), started to have congestion and cough. - Works with [employer]. Some kids have been sick recently and having coughs - Had a fever to 102F. Last fever was Tuesday.  - No body aches, N/V/D, SOB - Has been taking Coricidin for symptoms - Has not taken BP med yet today  OBJECTIVE:   BP (!) 154/89   Pulse 85   Ht 5\' 2"  (1.575 m)   Wt 100 lb 3.2 oz (45.5 kg)   SpO2 99%   BMI 18.33 kg/m   General: Alert, pleasant woman. NAD. HEENT: NCAT. MMM. CV: RRR, no murmurs.  Resp: Course expiratory breath sounds in LL lung field. Good air movement throughout. Normal WOB on RA and speaking in full sentences.  Abm: Soft, nontender, nondistended. BS present. Ext: Moves all ext spontaneously Skin: Warm, well perfused   ASSESSMENT/PLAN:   Viral URI 1wk hx of congestion,cough, and fever. Afebrile x3 days now. VSS on RA. Differential includes viral URI, PNA, bronchitis, asthma, COPD. Viral URI is most likely given mild degree of symptoms, and symptoms resolving within ~1 week. PNA is considered given focal course lung sounds, but pt is improving without antibx. Asthma and COPD would not explain fever. - Continue conservative management for now. Coricidin prn for fever and discomfort.  - If still fevering by Monday, instructed to call back and consider sending antibx course for PNA at that time. Could also consider CXR if unsure.  Essential hypertension BP elevated today, but pt did not take meds this morning and is acutely ill. Will recheck BP once current illness resolves.   Lincoln Brigham, MD [facility]

## 2023-05-14 NOTE — Patient Instructions (Addendum)
Good to see you today - Thank you for coming in  Things we discussed today:  1) Your cough is most likely from a viral infection.  - Drink plenty of fluids to stay hydrated - Continue taking Coricidin as needed for fevers, discomfort - If you are still having fevers of 100.83F or higher by Monday Oct 21st, call back to our clinic and I will send in antibiotics.   Please seek further medical attention if you" - have trouble breathing - are vomiting uncontrollably - can't drink enough to stay hydrated

## 2023-06-28 ENCOUNTER — Ambulatory Visit
Admission: RE | Admit: 2023-06-28 | Discharge: 2023-06-28 | Disposition: A | Discharge status: Home / Self Care | Payer: BC Managed Care – PPO | Source: Ambulatory Visit | Attending: Family Medicine | Admitting: Family Medicine

## 2023-06-28 ENCOUNTER — Encounter: Payer: Self-pay | Admitting: Student

## 2023-06-28 ENCOUNTER — Ambulatory Visit: Payer: BC Managed Care – PPO | Admitting: Student

## 2023-06-28 VITALS — BP 128/60 | HR 102 | Temp 103.0°F | Ht 62.0 in | Wt 100.0 lb

## 2023-06-28 DIAGNOSIS — J22 Unspecified acute lower respiratory infection: Secondary | ICD-10-CM | POA: Diagnosis not present

## 2023-06-28 DIAGNOSIS — R509 Fever, unspecified: Secondary | ICD-10-CM

## 2023-06-28 MED ORDER — AMOXICILLIN-POT CLAVULANATE 875-125 MG PO TABS: 1.0000 | ORAL_TABLET | Freq: Two times a day (BID) | ORAL | 0 refills | Status: AC

## 2023-06-28 MED ORDER — ACETAMINOPHEN 500 MG PO TABS
500.0000 mg | ORAL_TABLET | Freq: Once | ORAL | Status: AC
  Administered 2023-06-28: 500 mg via ORAL

## 2023-06-28 NOTE — Patient Instructions (Addendum)
It was great to see you! Thank you for allowing me to participate in your care!   Our plans for today:  - I am prescribing an antibiotic to take for 5 days (augmentin) - I am getting a chest xray to check for a pneumonia -we are giving you tylenol here-please take as needed for fevers  Take care and seek immediate care sooner if you develop any concerns.  Levin Erp, MD

## 2023-06-28 NOTE — Progress Notes (Signed)
    SUBJECTIVE:   CHIEF COMPLAINT / HPI:   The patient presents with a persistent cough that has been ongoing since the previous Sunday. She describes the cough as hard to shake, and it has been a recurring issue for her. The cough is often accompanied by clear nasal mucus, which sometimes darkens in color. She also reports having a tactile fever that started the day before the visit. The patient has had similar episodes in the past, with the last one occurring in October and lasting until November. She also reports a recent episode of diarrhea. She denies any history of smoking or asthma. No other sick contacts  PERTINENT  PMH / PSH: reviewed  OBJECTIVE:   BP 128/60   Pulse (!) 102   Temp (!) 103 F (39.4 C)   Ht 5\' 2"  (1.575 m)   Wt 100 lb (45.4 kg)   SpO2 97%   BMI 18.29 kg/m   General: Ill but nontoxic, NAD, awake, alert, responsive to questions Head: Normocephalic atraumatic, nasal congestion present, mild erythematous throat without exudates, productive cough, no cervical adenopathy CV: Regular rate and rhythm no murmurs rubs or gallops Respiratory: Clear to ausculation bilaterally, no wheezes rales or crackles, chest rises symmetrically,  no increased work of breathing on RA  ASSESSMENT/PLAN:   Assessment & Plan Fever, unspecified fever cause Fever to 103 here in office with mild tachycardia.  Productive sounding cough on examination.  Possible viral etiology however with such a high temperature and age will obtain chest x-ray and start antibiotics prophylactically after talking with Dr. Jennette Kettle. - acetaminophen (TYLENOL) tablet 500 mg given in office - DG Chest 2 View; Future - amoxicillin-clavulanate (AUGMENTIN) 875-125 MG tablet; Take 1 tablet by mouth 2 (two) times daily for 5 days.  Dispense: 10 tablet; Refill: 0  -ED/return precautions discussed    Levin Erp, MD [facility]

## 2023-07-15 ENCOUNTER — Telehealth: Payer: Self-pay | Admitting: Student

## 2023-07-15 NOTE — Telephone Encounter (Signed)
Patient walked in stating she needs a letter of clearance for some missed time at work. Missed time is 06/28/2023 - 06/29/2023  Late on Last  OV was  06/28/23.

## 2023-07-19 NOTE — Telephone Encounter (Signed)
Called patient and advised that letter was ready for pick up.   Placed letter at desk for pick up.   Veronda Prude, RN

## 2023-08-10 ENCOUNTER — Telehealth: Payer: Self-pay | Admitting: Student

## 2023-08-10 NOTE — Telephone Encounter (Signed)
 Patients sister Elease Hashimoto walked in and requested that Dr. Laroy Apple call her regarding the patient's physical health (lot of weight loss) and retention  [phone]

## 2023-08-10 NOTE — Telephone Encounter (Signed)
 Called and left a voice mail informing the patient to contact us back to make an appt.

## 2023-08-24 ENCOUNTER — Encounter: Payer: Self-pay | Admitting: Student

## 2023-08-24 ENCOUNTER — Ambulatory Visit (INDEPENDENT_AMBULATORY_CARE_PROVIDER_SITE_OTHER): Payer: 59 | Admitting: Student

## 2023-08-24 VITALS — BP 138/72 | HR 97 | Ht 62.0 in | Wt 99.0 lb

## 2023-08-24 DIAGNOSIS — R627 Adult failure to thrive: Secondary | ICD-10-CM | POA: Diagnosis not present

## 2023-08-24 DIAGNOSIS — R0781 Pleurodynia: Secondary | ICD-10-CM | POA: Diagnosis not present

## 2023-08-24 NOTE — Progress Notes (Signed)
    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  Sometimes bothersome and hard.  Dedicated rib x-ray previously showed no acute issues.  No respiratory symptoms  PERTINENT  PMH / PSH: reviewed  OBJECTIVE:   BP 138/72   Pulse 97   Ht 5\' 2"  (1.575 m)   Wt 99 lb (44.9 kg)   SpO2 97%   BMI 18.11 kg/m   General: Well appearing, NAD, awake, alert, responsive to questions Head: Normocephalic atraumatic Respiratory:  chest rises symmetrically,  no increased work of breathing on RA, left anterior chest with bony prominence to palpation Abdomen: Soft, non-tender, non-distended, normoactive bowel sounds  Extremities: Moves upper and lower extremities freely  CMA Tasha Nelson present during entire encounter involving chest examination ASSESSMENT/PLAN:   Assessment & Plan Poor weight gain in adult Patient has wavered around the low 100s since 2018 on chart review.  Has not had significant weight loss.  Does have issues with appetite and prioritizing eating. -Start Ensure supplementation -1 month follow-up, if not improving consider initiation of mirtazapine -CBC, CMP, TSH Rib pain Appears to be patient's rib.  She is quite slender I do not see any masses or areas of concern which I reiterated to patient. -Consider repeat x-ray if continuing to be bothersome   Memory Concerns Not discussed in detail  during this visit. -Plan to address in more detail at next visit.  Levin Erp, MD [facility]

## 2023-08-24 NOTE — Patient Instructions (Addendum)
It was great to see you! Thank you for allowing me to participate in your care!   Our plans for today:  -1 month follow up -We will get some baseline labs today - Ensure supplements 1-2 times a day with your meals    Take care and seek immediate care sooner if you develop any concerns.  Levin Erp, MD

## 2023-08-25 ENCOUNTER — Encounter: Payer: Self-pay | Admitting: Student

## 2023-08-25 LAB — COMPREHENSIVE METABOLIC PANEL
ALT: 24 [IU]/L (ref 0–32)
AST: 96 [IU]/L — ABNORMAL HIGH (ref 0–40)
Albumin: 4.2 g/dL (ref 3.9–4.9)
Alkaline Phosphatase: 63 [IU]/L (ref 44–121)
BUN/Creatinine Ratio: 37 — ABNORMAL HIGH (ref 12–28)
BUN: 30 mg/dL — ABNORMAL HIGH (ref 8–27)
Bilirubin Total: 0.2 mg/dL (ref 0.0–1.2)
CO2: 27 mmol/L (ref 20–29)
Calcium: 9.6 mg/dL (ref 8.7–10.3)
Chloride: 104 mmol/L (ref 96–106)
Creatinine, Ser: 0.82 mg/dL (ref 0.57–1.00)
Globulin, Total: 2.8 g/dL (ref 1.5–4.5)
Glucose: 106 mg/dL — ABNORMAL HIGH (ref 70–99)
Potassium: 4.2 mmol/L (ref 3.5–5.2)
Sodium: 141 mmol/L (ref 134–144)
Total Protein: 7 g/dL (ref 6.0–8.5)
eGFR: 80 mL/min/{1.73_m2} (ref >59)

## 2023-08-25 LAB — CBC WITH DIFFERENTIAL/PLATELET
Basophils Absolute: 0 10*3/uL (ref 0.0–0.2)
Basos: 1 %
EOS (ABSOLUTE): 0.3 10*3/uL (ref 0.0–0.4)
Eos: 5 %
Hematocrit: 38.4 % (ref 34.0–46.6)
Hemoglobin: 12.3 g/dL (ref 11.1–15.9)
Immature Grans (Abs): 0 10*3/uL (ref 0.0–0.1)
Immature Granulocytes: 0 %
Lymphocytes Absolute: 2.1 10*3/uL (ref 0.7–3.1)
Lymphs: 41 %
MCH: 30.8 pg (ref 26.6–33.0)
MCHC: 32 g/dL (ref 31.5–35.7)
MCV: 96 fL (ref 79–97)
Monocytes Absolute: 0.5 10*3/uL (ref 0.1–0.9)
Monocytes: 10 %
Neutrophils Absolute: 2.2 10*3/uL (ref 1.4–7.0)
Neutrophils: 43 %
Platelets: 144 10*3/uL — ABNORMAL LOW (ref 150–450)
RBC: 3.99 x10E6/uL (ref 3.77–5.28)
RDW: 12.4 % (ref 11.7–15.4)
WBC: 5.1 10*3/uL (ref 3.4–10.8)

## 2023-08-25 LAB — TSH RFX ON ABNORMAL TO FREE T4: TSH: 0.913 u[IU]/mL (ref 0.450–4.500)

## 2024-01-04 ENCOUNTER — Encounter: Payer: Self-pay | Admitting: *Deleted

## 2024-01-25 ENCOUNTER — Ambulatory Visit: Admitting: Family Medicine

## 2024-01-25 ENCOUNTER — Telehealth: Payer: Self-pay

## 2024-01-25 ENCOUNTER — Encounter: Payer: Self-pay | Admitting: Family Medicine

## 2024-01-25 VITALS — BP 142/70 | HR 85 | Ht 62.0 in | Wt 102.0 lb

## 2024-01-25 DIAGNOSIS — J302 Other seasonal allergic rhinitis: Secondary | ICD-10-CM

## 2024-01-25 DIAGNOSIS — I10 Essential (primary) hypertension: Secondary | ICD-10-CM | POA: Diagnosis not present

## 2024-01-25 NOTE — Patient Instructions (Signed)
 It was so good to see you today! Thank you for allowing me to take care of you.  Today we discussed the following concerns and plans:  High blood pressure - try taking your blood pressure medicine in the evening since it makes you drowsy. - try not to miss any doses, and keep checking your pressures at home.  I will see you in a couple of weeks to recheck your pressures.  If you have any concerns, please call the clinic or schedule an appointment.  It was a pleasure to take care of you today. Be well!  Lauraine Norse, DO [facility], PGY-2  Don't forget to check out the [facility] in [facility] at [address] [phone] Affordable prices on prescriptions and over-the-counter items, as well as services like vaccinations and medication home delivery.

## 2024-01-25 NOTE — Telephone Encounter (Signed)
 Patient calls nurse line requesting an apt.   She reports a nurse came to her home this morning for a wellness visit for [facility].   She reports her BP was 172/130. She reports she took it again and it did go down. She reports she has not taken her BP medication today. She reports she just took it.  She denies any headaches, chest pains, shortness of breath or vision changes.  Patient scheduled for this afternoon for evaluation.   ED precautions discussed.

## 2024-01-25 NOTE — Progress Notes (Signed)
    SUBJECTIVE:   CHIEF COMPLAINT / HPI:   High blood pressure According to telephone note, patient called nurse line this morning to report that her nurse came to her house for a wellness visit and found her blood pressure to be 172/130, though this did go down on recheck. She did take her blood pressure medication today - valsartan -HCTZ 80-12.5 mg. Today she denies HA, dizziness, vision changes, SOB, or abdominal pain/upset. She shares with me that shortly before the nurse took her BP at home this AM she received a call that a young family member had passed away. She does not take her BP medication every day because it makes her drowsy - may take it 3 days out of the week.   PERTINENT  PMH / PSH: Reviewed.  OBJECTIVE:   BP (!) 160/80   Pulse 85   Ht 5' 2 (1.575 m)   Wt 102 lb (46.3 kg)   SpO2 99%   BMI 18.66 kg/m    General: well-appearing, no acute distress. HEENT: normocephalic, PERRLA, EOM grossly intact, MMM. Cardio: Regular rate, regular rhythm, no murmurs on exam. Pulm: Clear, no wheezing, no crackles. No increased work of breathing. Abdominal: bowel sounds present, soft, non-tender, non-distended. Extremities: no peripheral edema. Moves all extremities equally. Neuro: Alert and oriented x3, speech normal in content, no facial asymmetry.   ASSESSMENT/PLAN:   Assessment & Plan Hypertension, unspecified type Uncontrolled.  Suspect elevated pressures are in the setting of inconsistent medication use and very stressful news this morning. -Patient will start taking blood pressure medicine in the evening since it makes her drowsy; she will not skip any doses. - She will continue to monitor her blood pressure at home - Follow-up in 2 weeks to recheck pressures - Return precautions given    Lauraine Norse, DO [facility]

## 2024-02-10 ENCOUNTER — Ambulatory Visit: Payer: Self-pay | Admitting: Family Medicine

## 2024-02-10 NOTE — Progress Notes (Deleted)
    SUBJECTIVE:   CHIEF COMPLAINT / HPI:   BP f/u Hypertension Patient is a 63 y.o. female who present today for follow up of hypertension.   Patient endorses {[id number]} compliance and tolerance of home medication regimen. Home medications include: *** Does not regularly check home BP. BP range at home: *** Denies any headache, vision changes, SOB or chest pain. Most recent Creatine: Last Microalbumin:   ***  PERTINENT  PMH / PSH: ***  OBJECTIVE:   There were no vitals taken for this visit.  ***  ASSESSMENT/PLAN:   Assessment & Plan      Lauraine Norse, DO [facility]

## 2024-02-21 ENCOUNTER — Other Ambulatory Visit: Payer: Self-pay | Admitting: Family

## 2024-02-21 DIAGNOSIS — R748 Abnormal levels of other serum enzymes: Secondary | ICD-10-CM

## 2024-02-25 ENCOUNTER — Other Ambulatory Visit

## 2024-06-12 ENCOUNTER — Encounter: Payer: Self-pay | Admitting: Family Medicine

## 2024-06-12 DIAGNOSIS — Z1231 Encounter for screening mammogram for malignant neoplasm of breast: Secondary | ICD-10-CM

## 2024-06-19 ENCOUNTER — Other Ambulatory Visit: Payer: Self-pay | Admitting: Family

## 2024-06-19 DIAGNOSIS — Z1231 Encounter for screening mammogram for malignant neoplasm of breast: Secondary | ICD-10-CM

## 2024-07-18 ENCOUNTER — Ambulatory Visit
Admission: RE | Admit: 2024-07-18 | Discharge: 2024-07-18 | Disposition: A | Discharge status: Home / Self Care | Source: Ambulatory Visit | Attending: Family | Admitting: Family

## 2024-07-18 DIAGNOSIS — Z1231 Encounter for screening mammogram for malignant neoplasm of breast: Secondary | ICD-10-CM

## 2024-08-22 ENCOUNTER — Other Ambulatory Visit: Payer: Self-pay | Admitting: Family

## 2024-08-22 DIAGNOSIS — R413 Other amnesia: Secondary | ICD-10-CM

## 2024-08-29 ENCOUNTER — Encounter: Payer: Self-pay | Admitting: Family

## 2024-08-31 ENCOUNTER — Inpatient Hospital Stay: Admission: RE | Admit: 2024-08-31 | Discharge: 2024-08-31 | Attending: Family | Admitting: Family

## 2024-08-31 DIAGNOSIS — R413 Other amnesia: Secondary | ICD-10-CM
